# Patient Record
Sex: Male | Born: 1985 | Race: Black or African American | Hispanic: No | Marital: Single | Smoking: Current every day smoker
Health system: Southern US, Community
[De-identification: ages and names within clinical notes are randomized; demographics above are authoritative.]

## PROBLEM LIST (undated history)

## (undated) DIAGNOSIS — F17200 Nicotine dependence, unspecified, uncomplicated: Secondary | ICD-10-CM

---

## 2018-06-26 ENCOUNTER — Ambulatory Visit: Payer: Medicaid Other

## 2018-06-26 ENCOUNTER — Ambulatory Visit
Admission: EM | Admit: 2018-06-26 | Discharge: 2018-06-26 | Disposition: A | Discharge status: Home / Self Care | Payer: Medicaid Other | Attending: Family Medicine | Admitting: Family Medicine

## 2018-06-26 DIAGNOSIS — W260XXA Contact with knife, initial encounter: Secondary | ICD-10-CM | POA: Diagnosis not present

## 2018-06-26 DIAGNOSIS — S81812A Laceration without foreign body, left lower leg, initial encounter: Secondary | ICD-10-CM | POA: Diagnosis not present

## 2018-06-26 DIAGNOSIS — S91114A Laceration without foreign body of right lesser toe(s) without damage to nail, initial encounter: Secondary | ICD-10-CM | POA: Insufficient documentation

## 2018-06-26 DIAGNOSIS — S81802A Unspecified open wound, left lower leg, initial encounter: Secondary | ICD-10-CM | POA: Diagnosis present

## 2018-06-26 MED ORDER — LEVOFLOXACIN 500 MG PO TABS: 500.0000 mg | ORAL_TABLET | Freq: Every day | ORAL | 0 refills | Status: DC

## 2018-06-26 MED ORDER — MUPIROCIN 2 % EX OINT: 1.0000 "application " | TOPICAL_OINTMENT | Freq: Three times a day (TID) | CUTANEOUS | 0 refills | Status: DC
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
## 2018-06-26 NOTE — Discharge Instructions (Addendum)
If any signs or symptoms of wound infection occur outlined in the attached instructions above, go  to the emergency room or return to our clinic immediately.  Recommend following up with a podiatrist next week because of the deep wound on your right foot  with possible tendon damage.
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
## 2018-06-30 ENCOUNTER — Encounter: Payer: Self-pay | Admitting: Emergency Medicine

## 2018-06-30 ENCOUNTER — Other Ambulatory Visit: Payer: Self-pay

## 2018-06-30 ENCOUNTER — Ambulatory Visit
Admission: EM | Admit: 2018-06-30 | Discharge: 2018-06-30 | Disposition: A | Discharge status: Home / Self Care | Payer: Medicaid Other | Attending: Family Medicine | Admitting: Family Medicine

## 2018-06-30 DIAGNOSIS — S81812D Laceration without foreign body, left lower leg, subsequent encounter: Secondary | ICD-10-CM

## 2018-06-30 DIAGNOSIS — S91114D Laceration without foreign body of right lesser toe(s) without damage to nail, subsequent encounter: Secondary | ICD-10-CM

## 2018-06-30 DIAGNOSIS — Z5189 Encounter for other specified aftercare: Secondary | ICD-10-CM

## 2018-06-30 NOTE — ED Provider Notes (Signed)
[facility]    CSN: [id number] Arrival date & time: 06/30/18  0854  History   Chief Complaint Chief Complaint  Patient presents with  . Wound Check   HPI  32 year old male presents for a wound check.  Patient had laceration repaired on his left leg and right foot on 11/21.  Patient was placed on prophylactic antibiotics given the nature and location of his injuries.  He seems to be doing well.  No bleeding.  No drainage.  No fever.  He has no concerns at this time.   Social History Social History   Tobacco Use  . Smoking status: Current Every Day Smoker  . Smokeless tobacco: Never Used  Substance Use Topics  . Alcohol use: Never    Frequency: Never  . Drug use: Never   Allergies   Patient has no known allergies.  Review of Systems Review of Systems  Constitutional: Negative.   Skin: Positive for wound.   Physical Exam Triage Vital Signs ED Triage Vitals  Enc Vitals Group     BP 06/30/18 0857 119/79     Pulse Rate 06/30/18 0857 (!) 58     Resp 06/30/18 0857 18     Temp 06/30/18 0857 (!) 97.5 F (36.4 C)     Temp Source 06/30/18 0857 Oral     SpO2 06/30/18 0857 100 %     Weight 06/30/18 0856 145 lb (65.8 kg)     Height 06/30/18 0856 5\' 6"  (1.676 m)     Head Circumference --      Peak Flow --      Pain Score 06/30/18 0856 0     Pain Loc --      Pain Edu? --      Excl. in GC? --    Updated Vital Signs BP 119/79 (BP Location: Right Arm)   Pulse (!) 58   Temp (!) 97.5 F (36.4 C) (Oral)   Resp 18   Ht 5\' 6"  (1.676 m)   Wt 65.8 kg   SpO2 100%   BMI 23.40 kg/m   Visual Acuity Right Eye Distance:   Left Eye Distance:   Bilateral Distance:    Right Eye Near:   Left Eye Near:    Bilateral Near:     Physical Exam  Constitutional: He appears well-developed. No distress.  Skin:  Left leg -wound appears to be healing well.  There is some surrounding dried blood.  No drainage.  No erythema.  Right foot -doing well.  No fluctuance.  No  drainage.  Nursing note and vitals reviewed.  UC Treatments / Results  Labs (all labs ordered are listed, but only abnormal results are displayed) Labs Reviewed - No data to display  EKG None  Radiology No results found.  Procedures Procedures (including critical care time)  Medications Ordered in UC Medications - No data to display  Initial Impression / Assessment and Plan / UC Course  I have reviewed the triage vital signs and the nursing notes.  Pertinent labs & imaging results that were available during my care of the patient were reviewed by me and considered in my medical decision making (see chart for details).    32 year old male presents for a wound check.  Healing well.  Encouraged to keep the wounds clean.  Wounds were cleaned today.  Sutures out in 10 days from 11/21 (On 12/1).  Final Clinical Impressions(s) / UC Diagnoses   Final diagnoses:  Visit for wound check  Discharge Instructions     Return for Suture removal on 12/1.  Take care  Dr. Adriana Simas    ED Prescriptions    None     Controlled Substance Prescriptions [place] Controlled Substance Registry consulted? Not Applicable   Tommie Sams, [place] 06/30/18 6045

## 2018-06-30 NOTE — Discharge Instructions (Signed)
Return for Suture removal on 12/1.  Take care  Dr. Adriana Simasook

## 2018-06-30 NOTE — ED Triage Notes (Signed)
Patient here for wound check from 2 lacerations on his left left and right foot. Patient states he has been taking his antibiotic that was prescribed.

## 2018-07-07 ENCOUNTER — Ambulatory Visit
Admission: EM | Admit: 2018-07-07 | Discharge: 2018-07-07 | Disposition: A | Discharge status: Home / Self Care | Payer: Medicaid Other

## 2018-07-07 ENCOUNTER — Encounter: Payer: Self-pay | Admitting: Emergency Medicine

## 2018-07-07 ENCOUNTER — Other Ambulatory Visit: Payer: Self-pay

## 2018-07-07 DIAGNOSIS — Z4802 Encounter for removal of sutures: Secondary | ICD-10-CM | POA: Diagnosis not present

## 2018-07-07 DIAGNOSIS — Z5189 Encounter for other specified aftercare: Secondary | ICD-10-CM

## 2018-07-07 NOTE — ED Triage Notes (Addendum)
Patient here for staple removal to left calf and suture removal to right foot.  7 staples removed from left calf. Patient tolerated well. Patient still has drainage and pain on right foot. Provider notified to see patient prior to taking out the stitches.

## 2018-07-07 NOTE — Discharge Instructions (Addendum)
Leave steri strips in place. Continue mupirocin ointment to foot wound three times a day. Follow-up in 2 days for recheck.

## 2018-07-07 NOTE — ED Provider Notes (Signed)
[facility]    CSN: [id number] Arrival date & time: 07/07/18  1842     History   Chief Complaint Chief Complaint  Patient presents with  . Suture / Staple Removal    HPI Theodore Adams is a 32 y.o. male.   Subjective:   Theodore Adams is a 32 y.o. male who presents today for wound check. Patient has a laceration wound which is located on the right foot and left leg. Current symptoms: yellow drainage from suture site on right foot. Symptoms began a few days ago. Pain is rated 0/10. Interventions to date: Started on antibiotics on 11/21 (Levaquan 500 mg QD x 5 days) as well as Bactroban TID. Wound appeared to look well on recheck on 11/25. Patient was told to return to clinic today for suture and staple removal. Patient reports compliance with prescribed therapies. Denies any fevers or pain. Patient has no complaints regarding laceration of left leg   32 year old male presenting for wound check/suture/staple.  Duration of left leg appears to be healing well.  Staples were removed by nursing difficulty.  No signs of infection (drainage, warmth or erythema). However, laceration of right foot appears to have opened up some despite sutures. Patient reports drainage from site. No drainage appreciated on exam. Patient is afebrile. No erythema or warmth noted. He was prescribed a five day course of levaquin at the initial visit. Sutures to be removed and steri strips applied. Patient advised to return to clinic in 48 hours for recheck.  Plan:  1. Discussed appropriate home care of this wound 2. Staples removed from left leg.  3. Sutures removed from right foot and steri strips applied  4. Patient instructions were given. 5. Follow up: 2 days.  Today's evaluation has revealed no signs of a dangerous process. Discussed diagnosis with patient. Patient aware of their diagnosis, possible red flag symptoms to watch out for and need for close follow up. Patient understands verbal and  written discharge instructions. Patient comfortable with plan and disposition.  Patient has a clear mental status at this time, good insight into illness (after discussion and teaching) and has clear judgment to make decisions regarding their care.  Documentation was completed with the aid of voice recognition software. Transcription may contain typographical errors.     History reviewed. No pertinent past medical history.  There are no active problems to display for this patient.   History reviewed. No pertinent surgical history.     Home Medications    Prior to Admission medications   Medication Sig Start Date End Date Taking? Authorizing Provider  mupirocin ointment (BACTROBAN) 2 % Apply 1 application topically 3 (three) times daily. 06/26/18  Yes Lutricia Feil, PA-C    Family History Family History  Problem Relation Age of Onset  . Healthy Mother   . Healthy Father     Social History Social History   Tobacco Use  . Smoking status: Current Every Day Smoker  . Smokeless tobacco: Never Used  Substance Use Topics  . Alcohol use: Never    Frequency: Never  . Drug use: Never     Allergies   Patient has no known allergies.   Review of Systems Review of Systems  Constitutional: Negative for fever.  Skin: Positive for wound.  All other systems reviewed and are negative.    Physical Exam Triage Vital Signs ED Triage Vitals  Enc Vitals Group     BP 07/07/18 1932 94/69     Pulse  Rate 07/07/18 1932 66     Resp 07/07/18 1932 18     Temp 07/07/18 1932 99 F (37.2 C)     Temp Source 07/07/18 1932 Oral     SpO2 07/07/18 1932 99 %     Weight 07/07/18 1931 145 lb (65.8 kg)     Height 07/07/18 1931 5\' 6"  (1.676 m)     Head Circumference --      Peak Flow --      Pain Score 07/07/18 1930 0     Pain Loc --      Pain Edu? --      Excl. in GC? --    No data found.  Updated Vital Signs BP 94/69 (BP Location: Right Arm)   Pulse 66   Temp 99 F (37.2 C)  (Oral)   Resp 18   Ht 5\' 6"  (1.676 m)   Wt 145 lb (65.8 kg)   SpO2 99%   BMI 23.40 kg/m   Visual Acuity Right Eye Distance:   Left Eye Distance:   Bilateral Distance:    Right Eye Near:   Left Eye Near:    Bilateral Near:     Physical Exam  Constitutional: He is oriented to person, place, and time. He appears well-developed and well-nourished.  Neck: Normal range of motion. Neck supple.  Cardiovascular: Normal rate and regular rhythm.  Pulmonary/Chest: Effort normal and breath sounds normal.  Musculoskeletal: Normal range of motion.       Right foot: There is laceration. There is no tenderness, no swelling and normal capillary refill.  Feet:  Right Foot:  Skin Integrity: Negative for erythema or warmth.  Neurological: He is alert and oriented to person, place, and time.  Skin: Skin is warm and dry.  Psychiatric: He has a normal mood and affect.       UC Treatments / Results  Labs (all labs ordered are listed, but only abnormal results are displayed) Labs Reviewed - No data to display  EKG None  Radiology No results found.  Procedures Procedures (including critical care time)  Medications Ordered in UC Medications - No data to display  Initial Impression / Assessment and Plan / UC Course  I have reviewed the triage vital signs and the nursing notes.  Pertinent labs & imaging results that were available during my care of the patient were reviewed by me and considered in my medical decision making (see chart for details).     32 year old male presenting for wound check/suture/staple.  Duration of left leg appears to be healing well.  Staples were removed by nursing difficulty.  No signs of infection (drainage, warmth or erythema). However, laceration of right foot appears to have opened up some despite sutures. Patient reports drainage from site. No drainage appreciated on exam. Patient is afebrile. No erythema or warmth noted. He was prescribed a five day  course of levaquin at the initial visit. Sutures to be removed and steri strips applied. Patient advised to return to clinic in 48 hours for recheck.  Plan:  1. Discussed appropriate home care of this wound 2. Staples removed from left leg.  3. Sutures removed from right foot and steri strips applied  4. Patient instructions were given. 5. Follow up: 2 days.  Today's evaluation has revealed no signs of a dangerous process. Discussed diagnosis with patient. Patient aware of their diagnosis, possible red flag symptoms to watch out for and need for close follow up. Patient understands verbal and written discharge instructions.  Patient comfortable with plan and disposition.  Patient has a clear mental status at this time, good insight into illness (after discussion and teaching) and has clear judgment to make decisions regarding their care.  Documentation was completed with the aid of voice recognition software. Transcription may contain typographical errors. Final Clinical Impressions(s) / UC Diagnoses   Final diagnoses:  Visit for wound check  Visit for suture removal  Removal of staples     Discharge Instructions     Leave steri strips in place. Continue mupirocin ointment to foot wound three times a day. Follow-up in 2 days for recheck.     ED Prescriptions    None     Controlled Substance Prescriptions [place] Controlled Substance Registry consulted? Not Applicable   Lurline IdolMurrill, Rashmi Tallent, FNP 07/07/18 2001

## 2018-07-10 ENCOUNTER — Ambulatory Visit
Admission: EM | Admit: 2018-07-10 | Discharge: 2018-07-10 | Disposition: A | Discharge status: Home / Self Care | Payer: Medicaid Other | Attending: Family Medicine | Admitting: Family Medicine

## 2018-07-10 DIAGNOSIS — Z5189 Encounter for other specified aftercare: Secondary | ICD-10-CM

## 2018-07-10 NOTE — Discharge Instructions (Signed)
Leave steri strips on.  Replace if needed.  See Podiatry.  Take care  Dr. Adriana Simasook

## 2018-07-10 NOTE — ED Provider Notes (Signed)
[facility]    CSN: [id number] Arrival date & time: 07/10/18  1642  History   Chief Complaint Chief Complaint  Patient presents with  . Wound Check   HPI  32 year old male presents for wound check.  Recently had sutures and staples removed.  Right foot wound remains open and has not yet healed.  Mild drainage.  Worse in the feet.  Mild pain.  Still sore.  Use he has not followed up with podiatry.  Presents today for wound check.  No fever.  No other reported symptoms.  No other complaints.  Social History Social History   Tobacco Use  . Smoking status: Current Every Day Smoker  . Smokeless tobacco: Never Used  Substance Use Topics  . Alcohol use: Never    Frequency: Never  . Drug use: Never    Allergies   Patient has no known allergies.   Review of Systems Review of Systems  Constitutional: Negative.   Skin: Positive for wound.   Physical Exam Triage Vital Signs ED Triage Vitals  Enc Vitals Group     BP 07/10/18 1655 120/80     Pulse Rate 07/10/18 1655 70     Resp 07/10/18 1655 18     Temp 07/10/18 1655 98.2 F (36.8 C)     Temp Source 07/10/18 1655 Oral     SpO2 07/10/18 1655 100 %     Weight 07/10/18 1656 145 lb 1 oz (65.8 kg)     Height --      Head Circumference --      Peak Flow --      Pain Score 07/10/18 1656 0     Pain Loc --      Pain Edu? --      Excl. in GC? --    Updated Vital Signs BP 120/80 (BP Location: Right Arm)   Pulse 70   Temp 98.2 F (36.8 C) (Oral)   Resp 18   Wt 65.8 kg   SpO2 100%   BMI 23.41 kg/m   Visual Acuity Right Eye Distance:   Left Eye Distance:   Bilateral Distance:    Right Eye Near:   Left Eye Near:    Bilateral Near:     Physical Exam  Constitutional: He is oriented to person, place, and time. He appears well-developed. No distress.  Pulmonary/Chest: Effort normal. No respiratory distress.  Neurological: He is alert and oriented to person, place, and time.  Skin:  Right foot - open  wound noted.  Mild drainage.  Psychiatric: He has a normal mood and affect. His behavior is normal.  Nursing note and vitals reviewed.  UC Treatments / Results  Labs (all labs ordered are listed, but only abnormal results are displayed) Labs Reviewed - No data to display  EKG None  Radiology No results found.  Procedures Procedures (including critical care time)  Medications Ordered in UC Medications - No data to display  Initial Impression / Assessment and Plan / UC Course  I have reviewed the triage vital signs and the nursing notes.  Pertinent labs & imaging results that were available during my care of the patient were reviewed by me and considered in my medical decision making (see chart for details).    32 year old male presents for wound check.  Wound is not yet healed.  Healing by secondary intention.  Steri-Strips placed to help approximate the wound better.  Advised to see podiatry.  Final Clinical Impressions(s) / UC Diagnoses   Final  diagnoses:  Visit for wound check     Discharge Instructions     Leave steri strips on.  Replace if needed.  See Podiatry.  Take care  Dr. Adriana Simasook    ED Prescriptions    None     Controlled Substance Prescriptions [place] Controlled Substance Registry consulted? Not Applicable   Tommie SamsCook, Kiarah Eckstein G, DO 07/10/18 1756
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
## 2020-08-25 ENCOUNTER — Encounter: Payer: Self-pay | Admitting: Emergency Medicine

## 2020-08-25 ENCOUNTER — Emergency Department
Admission: EM | Admit: 2020-08-25 | Discharge: 2020-08-25 | Disposition: A | Discharge status: Home / Self Care | Payer: Medicaid Other | Attending: Emergency Medicine | Admitting: Emergency Medicine

## 2020-08-25 ENCOUNTER — Other Ambulatory Visit: Payer: Self-pay

## 2020-08-25 ENCOUNTER — Emergency Department: Payer: Medicaid Other

## 2020-08-25 DIAGNOSIS — M546 Pain in thoracic spine: Secondary | ICD-10-CM | POA: Diagnosis not present

## 2020-08-25 DIAGNOSIS — R102 Pelvic and perineal pain: Secondary | ICD-10-CM | POA: Diagnosis not present

## 2020-08-25 DIAGNOSIS — M25551 Pain in right hip: Secondary | ICD-10-CM | POA: Insufficient documentation

## 2020-08-25 DIAGNOSIS — F17200 Nicotine dependence, unspecified, uncomplicated: Secondary | ICD-10-CM | POA: Insufficient documentation

## 2020-08-25 MED ORDER — METHOCARBAMOL 500 MG PO TABS: 500.0000 mg | ORAL_TABLET | Freq: Three times a day (TID) | ORAL | 0 refills | Status: AC | PRN

## 2020-08-25 MED ORDER — MELOXICAM 15 MG PO TABS: 15.0000 mg | ORAL_TABLET | Freq: Every day | ORAL | 2 refills | Status: AC
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
## 2020-08-25 NOTE — Discharge Instructions (Signed)
Take Meloxicam and Robaxin as directed.  
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
## 2023-12-21 ENCOUNTER — Inpatient Hospital Stay
Admission: EM | Admit: 2023-12-21 | Discharge: 2023-12-30 | DRG: 392 | Disposition: A | Discharge status: Home / Self Care | Attending: Hospitalist | Admitting: Hospitalist

## 2023-12-21 ENCOUNTER — Emergency Department

## 2023-12-21 DIAGNOSIS — K56609 Unspecified intestinal obstruction, unspecified as to partial versus complete obstruction: Secondary | ICD-10-CM

## 2023-12-21 DIAGNOSIS — A0811 Acute gastroenteropathy due to Norwalk agent: Principal | ICD-10-CM | POA: Diagnosis present

## 2023-12-21 DIAGNOSIS — R112 Nausea with vomiting, unspecified: Secondary | ICD-10-CM | POA: Diagnosis present

## 2023-12-21 DIAGNOSIS — K567 Ileus, unspecified: Secondary | ICD-10-CM | POA: Diagnosis not present

## 2023-12-21 DIAGNOSIS — E876 Hypokalemia: Secondary | ICD-10-CM | POA: Diagnosis not present

## 2023-12-21 DIAGNOSIS — K529 Noninfective gastroenteritis and colitis, unspecified: Secondary | ICD-10-CM | POA: Diagnosis not present

## 2023-12-21 DIAGNOSIS — E871 Hypo-osmolality and hyponatremia: Secondary | ICD-10-CM | POA: Diagnosis present

## 2023-12-21 DIAGNOSIS — F17200 Nicotine dependence, unspecified, uncomplicated: Secondary | ICD-10-CM | POA: Diagnosis present

## 2023-12-21 DIAGNOSIS — Z8249 Family history of ischemic heart disease and other diseases of the circulatory system: Secondary | ICD-10-CM

## 2023-12-21 DIAGNOSIS — E861 Hypovolemia: Secondary | ICD-10-CM | POA: Diagnosis present

## 2023-12-21 HISTORY — DX: Nicotine dependence, unspecified, uncomplicated: F17.200

## 2023-12-21 LAB — URINALYSIS, ROUTINE W REFLEX MICROSCOPIC
Bacteria, UA: NONE SEEN
Bilirubin Urine: NEGATIVE
Glucose, UA: NEGATIVE mg/dL
Ketones, ur: NEGATIVE mg/dL
Leukocytes,Ua: NEGATIVE
Nitrite: NEGATIVE
Protein, ur: 30 mg/dL — AB
Specific Gravity, Urine: 1.026 (ref 1.005–1.030)
Squamous Epithelial / HPF: 0 /HPF (ref 0–5)
pH: 5 (ref 5.0–8.0)

## 2023-12-21 LAB — PHOSPHORUS: Phosphorus: 3 mg/dL (ref 2.5–4.6)

## 2023-12-21 LAB — COMPREHENSIVE METABOLIC PANEL WITH GFR
ALT: 31 U/L (ref 0–44)
AST: 35 U/L (ref 15–41)
Albumin: 2.6 g/dL — ABNORMAL LOW (ref 3.5–5.0)
Alkaline Phosphatase: 88 U/L (ref 38–126)
Anion gap: 16 — ABNORMAL HIGH (ref 5–15)
BUN: 21 mg/dL — ABNORMAL HIGH (ref 6–20)
CO2: 22 mmol/L (ref 22–32)
Calcium: 8.4 mg/dL — ABNORMAL LOW (ref 8.9–10.3)
Chloride: 88 mmol/L — ABNORMAL LOW (ref 98–111)
Creatinine, Ser: 0.94 mg/dL (ref 0.61–1.24)
GFR, Estimated: >60 mL/min (ref >60)
Glucose, Bld: 138 mg/dL — ABNORMAL HIGH (ref 70–99)
Potassium: 4.3 mmol/L (ref 3.5–5.1)
Sodium: 126 mmol/L — ABNORMAL LOW (ref 135–145)
Total Bilirubin: 1.4 mg/dL — ABNORMAL HIGH (ref 0.0–1.2)
Total Protein: 7 g/dL (ref 6.5–8.1)

## 2023-12-21 LAB — BASIC METABOLIC PANEL WITH GFR
Anion gap: 12 (ref 5–15)
BUN: 17 mg/dL (ref 6–20)
CO2: 26 mmol/L (ref 22–32)
Calcium: 8 mg/dL — ABNORMAL LOW (ref 8.9–10.3)
Chloride: 93 mmol/L — ABNORMAL LOW (ref 98–111)
Creatinine, Ser: 0.85 mg/dL (ref 0.61–1.24)
GFR, Estimated: >60 mL/min (ref >60)
Glucose, Bld: 124 mg/dL — ABNORMAL HIGH (ref 70–99)
Potassium: 3.9 mmol/L (ref 3.5–5.1)
Sodium: 131 mmol/L — ABNORMAL LOW (ref 135–145)

## 2023-12-21 LAB — LACTIC ACID, PLASMA: Lactic Acid, Venous: 1.5 mmol/L (ref 0.5–1.9)

## 2023-12-21 LAB — CBC
HCT: 42.8 % (ref 39.0–52.0)
Hemoglobin: 15 g/dL (ref 13.0–17.0)
MCH: 29 pg (ref 26.0–34.0)
MCHC: 35 g/dL (ref 30.0–36.0)
MCV: 82.6 fL (ref 80.0–100.0)
Platelets: 212 10*3/uL (ref 150–400)
RBC: 5.18 MIL/uL (ref 4.22–5.81)
RDW: 12.9 % (ref 11.5–15.5)
WBC: 31.8 10*3/uL — ABNORMAL HIGH (ref 4.0–10.5)
nRBC: 0 % (ref 0.0–0.2)

## 2023-12-21 LAB — MAGNESIUM: Magnesium: 2.2 mg/dL (ref 1.7–2.4)

## 2023-12-21 LAB — LIPASE, BLOOD: Lipase: 32 U/L (ref 11–51)

## 2023-12-21 LAB — OSMOLALITY, URINE: Osmolality, Ur: 464 mosm/kg (ref 300–900)

## 2023-12-21 LAB — OSMOLALITY: Osmolality: 276 mosm/kg (ref 275–295)

## 2023-12-21 LAB — SODIUM, URINE, RANDOM: Sodium, Ur: <10 mmol/L

## 2023-12-21 MED ORDER — ONDANSETRON HCL 4 MG/2ML IJ SOLN
4.0000 mg | Freq: Once | INTRAMUSCULAR | Status: AC
  Administered 2023-12-21: 4 mg via INTRAVENOUS
  Filled 2023-12-21: qty 2

## 2023-12-21 MED ORDER — MORPHINE SULFATE (PF) 2 MG/ML IV SOLN
2.0000 mg | INTRAVENOUS | Status: DC | PRN
  Administered 2023-12-22: 2 mg via INTRAVENOUS
  Filled 2023-12-21: qty 1

## 2023-12-21 MED ORDER — LACTATED RINGERS IV SOLN: INTRAVENOUS | Status: DC

## 2023-12-21 MED ORDER — LACTATED RINGERS IV SOLN
INTRAVENOUS | Status: DC
Start: 2023-12-21 — End: 2023-12-22

## 2023-12-21 MED ORDER — NICOTINE 21 MG/24HR TD PT24
21.0000 mg | MEDICATED_PATCH | Freq: Every day | TRANSDERMAL | Status: DC
  Filled 2023-12-21 (×5): qty 1

## 2023-12-21 MED ORDER — IOHEXOL 300 MG/ML  SOLN
80.0000 mL | Freq: Once | INTRAMUSCULAR | Status: AC | PRN
  Administered 2023-12-21: 80 mL via INTRAVENOUS

## 2023-12-21 MED ORDER — LACTATED RINGERS IV BOLUS
1000.0000 mL | Freq: Once | INTRAVENOUS | Status: AC
  Administered 2023-12-21: 1000 mL via INTRAVENOUS

## 2023-12-21 MED ORDER — SODIUM CHLORIDE 1 G PO TABS: 1.0000 g | ORAL_TABLET | Freq: Two times a day (BID) | ORAL | Status: DC

## 2023-12-21 MED ORDER — OXYCODONE-ACETAMINOPHEN 5-325 MG PO TABS
1.0000 | ORAL_TABLET | ORAL | Status: DC | PRN
  Administered 2023-12-22: 1 via ORAL
  Filled 2023-12-21: qty 1

## 2023-12-21 MED ORDER — MORPHINE SULFATE (PF) 4 MG/ML IV SOLN
4.0000 mg | Freq: Once | INTRAVENOUS | Status: AC
  Administered 2023-12-21: 4 mg via INTRAVENOUS
  Filled 2023-12-21: qty 1

## 2023-12-21 MED ORDER — PIPERACILLIN-TAZOBACTAM 3.375 G IVPB 30 MIN
3.3750 g | Freq: Once | INTRAVENOUS | Status: AC
  Administered 2023-12-21: 3.375 g via INTRAVENOUS
  Filled 2023-12-21 (×2): qty 50

## 2023-12-21 MED ORDER — ACETAMINOPHEN 325 MG PO TABS: 650.0000 mg | ORAL_TABLET | Freq: Four times a day (QID) | ORAL | Status: DC | PRN

## 2023-12-21 MED ORDER — PIPERACILLIN-TAZOBACTAM 3.375 G IVPB
3.3750 g | Freq: Three times a day (TID) | INTRAVENOUS | Status: DC
  Administered 2023-12-22 – 2023-12-23 (×4): 3.375 g via INTRAVENOUS
  Filled 2023-12-21 (×4): qty 50

## 2023-12-21 MED ORDER — ONDANSETRON HCL 4 MG/2ML IJ SOLN
4.0000 mg | Freq: Three times a day (TID) | INTRAMUSCULAR | Status: DC | PRN
  Administered 2023-12-22: 4 mg via INTRAVENOUS
  Filled 2023-12-21: qty 2

## 2023-12-21 NOTE — ED Provider Notes (Addendum)
 [facility] Provider Note    Event Date/Time   First MD Initiated Contact with Patient 12/21/23 1649     (approximate)   History   Emesis and Diarrhea   HPI  Theodore Adams is a 38 y.o. male who presents to the ED for evaluation of Emesis and Diarrhea   Patient presents with his girlfriend for evaluation of lower abdominal pain and diarrhea over the past 7 days.  Symptoms started this past Sunday, he had 1 episode of emesis around then, but is had persistent lower abdominal pain and diarrhea.  They estimate 10-15 episodes of loose stool per day.  Some dizziness and weakness but no syncope.  No dysuria.    No history of abdominal surgeries.  No known fevers.  No recent antibiotics for any reason.   Physical Exam   Triage Vital Signs: ED Triage Vitals  Encounter Vitals Group     BP 12/21/23 1642 (!) 129/91     Systolic BP Percentile --      Diastolic BP Percentile --      Pulse Rate 12/21/23 1642 100     Resp 12/21/23 1642 20     Temp 12/21/23 1642 99.7 F (37.6 C)     Temp Source 12/21/23 1642 Oral     SpO2 12/21/23 1642 99 %     Weight 12/21/23 1644 135 lb (61.2 kg)     Height 12/21/23 1644 5\' 6"  (1.676 m)     Head Circumference --      Peak Flow --      Pain Score 12/21/23 1644 10     Pain Loc --      Pain Education --      Exclude from Growth Chart --     Most recent vital signs: Vitals:   12/21/23 1642  BP: (!) 129/91  Pulse: 100  Resp: 20  Temp: 99.7 F (37.6 C)  SpO2: 99%    General: Awake, no distress.  Looks dry mildly uncomfortable CV:  Good peripheral perfusion.  Resp:  Normal effort.  Abd:  No distention.  RLQ tenderness is present around McBurney's point with some voluntary guarding on deeper palpation.  Benign upper abdomen.  Lesser tenderness to the LLQ MSK:  No deformity noted.  Neuro:  No focal deficits appreciated. Other:     ED Results / Procedures / Treatments   Labs (all labs ordered are listed, but  only abnormal results are displayed) Labs Reviewed  COMPREHENSIVE METABOLIC PANEL WITH GFR - Abnormal; Notable for the following components:      Result Value   Sodium 126 (*)    Chloride 88 (*)    Glucose, Bld 138 (*)    BUN 21 (*)    Calcium 8.4 (*)    Albumin 2.6 (*)    Total Bilirubin 1.4 (*)    Anion gap 16 (*)    All other components within normal limits  CBC - Abnormal; Notable for the following components:   WBC 31.8 (*)    All other components within normal limits  CULTURE, BLOOD (ROUTINE X 2)  CULTURE, BLOOD (ROUTINE X 2)  C DIFFICILE QUICK SCREEN W PCR REFLEX    GASTROINTESTINAL PANEL BY PCR, STOOL (REPLACES STOOL CULTURE)  LIPASE, BLOOD  LACTIC ACID, PLASMA  URINALYSIS, ROUTINE W REFLEX MICROSCOPIC    EKG   RADIOLOGY CT abd/pelvis interpreted by me with dilated loops of bowel  Official radiology report(s): CT ABDOMEN PELVIS W CONTRAST Result Date:  12/21/2023 CLINICAL DATA:  lower abd pain, diarrhea. WBC 31k EXAM: CT ABDOMEN AND PELVIS WITH CONTRAST TECHNIQUE: Multidetector CT imaging of the abdomen and pelvis was performed using the standard protocol following bolus administration of intravenous contrast. RADIATION DOSE REDUCTION: This exam was performed according to the departmental dose-optimization program which includes automated exposure control, adjustment of the mA and/or kV according to patient size and/or use of iterative reconstruction technique. CONTRAST:  80mL OMNIPAQUE IOHEXOL 300 MG/ML  SOLN COMPARISON:  None Available. FINDINGS: Lower chest: No acute abnormality. Hepatobiliary: No focal liver abnormality is seen. No gallstones, gallbladder wall thickening, or biliary dilatation. Pancreas: Unremarkable. No pancreatic ductal dilatation or surrounding inflammatory changes. Spleen: Normal in size without focal abnormality. Adrenals/Urinary Tract: Adrenal glands are unremarkable. Kidneys are normal, without renal calculi, focal lesion, or hydronephrosis. Bladder  is unremarkable. Stomach/Bowel: There is dilation of the distal colon with marked circumferential wall thickening and submucosal enhancement of the rectum. There is adjacent fat stranding in the perirectal fat. Submucosal enhancement and wall thickening extends into the sigmoid colon and descending colon. These portions of the colon are relatively featureless in appearance. There is moderate upstream colonic stool volume. Appendix is normal. Stomach is unremarkable for degree of distension. No definitive small bowel obstruction although several loops of small bowel are prominent in appearance. Vascular/Lymphatic: Aorta is normal in course and caliber. Prominent mesenteric venous vasculature. No definitive lymphadenopathy identified. Reproductive: Prostate is unremarkable. Other: No free air. Musculoskeletal: No acute or significant osseous findings. IMPRESSION: 1. There is dilation, marked circumferential wall thickening and submucosal enhancement of the rectum with adjacent fat stranding. This extends into the descending colon in a contiguous fashion. Findings are favored to reflect an inflammatory colitis such as ulcerative colitis although infectious colitis remains in the differential. Recommend correlation with laboratory values and consideration of colonoscopy when clinically appropriate. Electronically Signed   By: Clancy Crimes M.D.   On: 12/21/2023 18:22    PROCEDURES and INTERVENTIONS:  .Critical Care  Performed by: Arline Bennett, MD Authorized by: Arline Bennett, MD   Critical care provider statement:    Critical care time (minutes):  30   Critical care time was exclusive of:  Separately billable procedures and treating other patients   Critical care was necessary to treat or prevent imminent or life-threatening deterioration of the following conditions:  Sepsis   Critical care was time spent personally by me on the following activities:  Development of treatment plan with patient or  surrogate, discussions with consultants, evaluation of patient's response to treatment, examination of patient, ordering and review of laboratory studies, ordering and review of radiographic studies, ordering and performing treatments and interventions, pulse oximetry, re-evaluation of patient's condition and review of old charts   Medications  morphine (PF) 2 MG/ML injection 2 mg (has no administration in time range)  oxyCODONE-acetaminophen (PERCOCET/ROXICET) 5-325 MG per tablet 1 tablet (has no administration in time range)  lactated ringers infusion (has no administration in time range)  lactated ringers bolus 1,000 mL (0 mLs Intravenous Stopped 12/21/23 1827)  ondansetron (ZOFRAN) injection 4 mg (4 mg Intravenous Given 12/21/23 1718)  morphine (PF) 4 MG/ML injection 4 mg (4 mg Intravenous Given 12/21/23 1718)  piperacillin-tazobactam (ZOSYN) IVPB 3.375 g (0 g Intravenous Stopped 12/21/23 1826)  iohexol (OMNIPAQUE) 300 MG/ML solution 80 mL (80 mLs Intravenous Contrast Given 12/21/23 1757)     IMPRESSION / MDM / ASSESSMENT AND PLAN / ED COURSE  I reviewed the triage vital signs and the nursing notes.  Differential diagnosis includes, but is not limited to, appendicitis, perforation, sepsis, colitis, diverticulitis, abscess, UTI, pyelonephritis  {Patient presents with symptoms of an acute illness or injury that is potentially life-threatening.  Generally healthy 39 year old presents with a week of abdominal pain and diarrhea.  He has significant RLQ tenderness, leukocytosis and tachycardia.  Will add sepsis screening protocols, CT scan, IV fluids.  Patient presents with 1 week of severe diarrhea and abdominal pain with signs of severe colitis requiring medical admission. No known hx of UC, IBD or colonoscopies.  Tachycardia, borderline temperature and leukocytosis to 32 indicate sepsis protocols.  Hyponatremia without AKI.  Mild anion gap.  CT, as above.  Provided fluids, antibiotics.  Stool  sample pending.  Consult medicine for admission.  Clinical Course as of 12/21/23 1849  Sat Dec 21, 2023  1710 Updated patient, nurse and girlfriend of elevated white blood cell count.  Will add on blood cultures, lactic acid [DS]  1750 Reassessed.  Sister and cousin at the bedside.  Discussed my concerns, abnormal workup, plan of care. [DS]  1848 Update patient and family of CT results and possible etiologies of symptoms.  I then consulted hospitalist for admission. [DS]    Clinical Course User Index [DS] Arline Bennett, MD     FINAL CLINICAL IMPRESSION(S) / ED DIAGNOSES   Final diagnoses:  Colitis     Rx / DC Orders   ED Discharge Orders     None        Note:  This document was prepared using Dragon voice recognition software and may include unintentional dictation errors.   Arline Bennett, MD 12/21/23 1840    Arline Bennett, MD 12/21/23 [id number]

## 2023-12-21 NOTE — ED Triage Notes (Signed)
 Pt presents to the ED POV from home with significant other for generalized abdominal pain, nausea, vomiting, and diarrhea since Sunday.

## 2023-12-21 NOTE — Progress Notes (Signed)
 CODE SEPSIS - PHARMACY COMMUNICATION  **Broad Spectrum Antibiotics should be administered within 1 hour of Sepsis diagnosis**  Time Code Sepsis Called/Page Received: 1842  Antibiotics Ordered: Zosyn  Time of 1st antibiotic administration: 1728  Additional action taken by pharmacy: N/A  If necessary, Name of Provider/Nurse Contacted: N/A    Alice Innocent ,PharmD Clinical Pharmacist  12/21/2023  6:42 PM

## 2023-12-21 NOTE — ED Notes (Signed)
 Patient transported to CT

## 2023-12-21 NOTE — Sepsis Progress Note (Signed)
 Elink following for sepsis protocol.

## 2023-12-21 NOTE — H&P (Signed)
 History and Physical    Theodore Adams ZOX:[id number] DOB: 1986-04-15 DOA: 12/21/2023  Referring MD/NP/PA:   PCP: [facility] [employer]   Patient coming from:  The patient is coming from home.     Chief Complaint: Nausea, vomiting, diarrhea, abdominal pain  HPI: Theodore Adams is a 38 y.o. male with medical history significant of smoker, who presents with, nausea, vomiting, diarrhea, abdominal pain.  Patient states that his symptoms have been going on for almost a week, including nausea, vomiting, diarrhea and abdominal pain.  He has multiple episodes of nonbilious nonbloody vomiting, more than 10 times of watery diarrhea each day.  His abdominal pain is located in the center and lower abdomen, constant, cramping, moderate to severe, nonradiating, not aggravated or alleviated any known factors.  No fever or chills.  No recent antibiotics use.  No chest pain, cough, SOB.  No symptoms of UTI.    Data reviewed independently and ED Course: pt was found to have WBC 31.8, lactic acid of 1.5, GFR> 60, sodium 126, temperature 99.7, blood pressure 129/91, heart rate of 100, RR 20, oxygen saturation 99% on room air.  Patient is placed in MedSurg bed for observation.  CT abdomen/pelvis: 1. There is dilation, marked circumferential wall thickening and submucosal enhancement of the rectum with adjacent fat stranding. This extends into the descending colon in a contiguous fashion. Findings are favored to reflect an inflammatory colitis such as ulcerative colitis although infectious colitis remains in the differential. Recommend correlation with laboratory values and consideration of colonoscopy when clinically appropriate.     EKG:  Not done in ED, will get one.    Review of Systems:   General: no fevers, chills, no body weight gain, has poor appetite, has fatigue HEENT: no blurry vision, hearing changes or sore throat Respiratory: no dyspnea, coughing, wheezing CV: no chest  pain, no palpitations GI: has nausea, vomiting, abdominal pain, diarrhea GU: no dysuria, burning on urination, increased urinary frequency, hematuria  Ext: no leg edema Neuro: no unilateral weakness, numbness, or tingling, no vision change or hearing loss Skin: no rash, no skin tear. MSK: No muscle spasm, no deformity, no limitation of range of movement in spin Heme: No easy bruising.  Travel history: No recent long distant travel.   Allergy: No Known Allergies  Past Medical History:  Diagnosis Date   Smoker     History reviewed. No pertinent surgical history.  Social History:  reports that he has been smoking. He has never used smokeless tobacco. He reports that he does not drink alcohol and does not use drugs.  Family History:  Family History  Problem Relation Age of Onset   Heart attack Mother    Heart attack Father      Prior to Admission medications   Medication Sig Start Date End Date Taking? Authorizing Provider  mupirocin  ointment (BACTROBAN ) 2 % Apply 1 application topically 3 (three) times daily. 06/26/18   Georgiann Kirsch, PA-C    Physical Exam: Vitals:   12/21/23 1642 12/21/23 1644  BP: (!) 129/91   Pulse: 100   Resp: 20   Temp: 99.7 F (37.6 C)   TempSrc: Oral   SpO2: 99%   Weight:  61.2 kg  Height:  5\' 6"  (1.676 m)   General: Not in acute distress.  Dry mucous membrane HEENT:       Eyes: PERRL, EOMI, no jaundice       ENT: No discharge from the ears and nose, no pharynx  injection, no tonsillar enlargement.        Neck: No JVD, no bruit, no mass felt. Heme: No neck lymph node enlargement. Cardiac: S1/S2, RRR, No murmurs, No gallops or rubs. Respiratory: No rales, wheezing, rhonchi or rubs. GI: Soft, nondistended, has tenderness in central and lower abdomen, no rebound pain, no organomegaly, BS present. GU: No hematuria Ext: No pitting leg edema bilaterally. 1+DP/PT pulse bilaterally. Musculoskeletal: No joint deformities, No joint redness or  warmth, no limitation of ROM in spin. Skin: No rashes.  Neuro: Alert, oriented X3, cranial nerves II-XII grossly intact, moves all extremities normally. Psych: Patient is not psychotic, no suicidal or hemocidal ideation.  Labs on Admission: I have personally reviewed following labs and imaging studies  CBC: Recent Labs  Lab 12/21/23 1647  WBC 31.8*  HGB 15.0  HCT 42.8  MCV 82.6  PLT 212   Basic Metabolic Panel: Recent Labs  Lab 12/21/23 1647  NA 126*  K 4.3  CL 88*  CO2 22  GLUCOSE 138*  BUN 21*  CREATININE 0.94  CALCIUM 8.4*  MG 2.2  PHOS 3.0   GFR: Estimated Creatinine Clearance: 94 mL/min (by C-G formula based on SCr of 0.94 mg/dL). Liver Function Tests: Recent Labs  Lab 12/21/23 1647  AST 35  ALT 31  ALKPHOS 88  BILITOT 1.4*  PROT 7.0  ALBUMIN 2.6*   Recent Labs  Lab 12/21/23 1647  LIPASE 32   No results for input(s): "AMMONIA" in the last 168 hours. Coagulation Profile: No results for input(s): "INR", "PROTIME" in the last 168 hours. Cardiac Enzymes: No results for input(s): "CKTOTAL", "CKMB", "CKMBINDEX", "TROPONINI" in the last 168 hours. BNP (last 3 results) No results for input(s): "PROBNP" in the last 8760 hours. HbA1C: No results for input(s): "HGBA1C" in the last 72 hours. CBG: No results for input(s): "GLUCAP" in the last 168 hours. Lipid Profile: No results for input(s): "CHOL", "HDL", "LDLCALC", "TRIG", "CHOLHDL", "LDLDIRECT" in the last 72 hours. Thyroid Function Tests: No results for input(s): "TSH", "T4TOTAL", "FREET4", "T3FREE", "THYROIDAB" in the last 72 hours. Anemia Panel: No results for input(s): "VITAMINB12", "FOLATE", "FERRITIN", "TIBC", "IRON", "RETICCTPCT" in the last 72 hours. Urine analysis:    Component Value Date/Time   COLORURINE YELLOW (A) 12/21/2023 1900   APPEARANCEUR CLEAR (A) 12/21/2023 1900   LABSPEC 1.026 12/21/2023 1900   PHURINE 5.0 12/21/2023 1900   GLUCOSEU NEGATIVE 12/21/2023 1900   HGBUR MODERATE  (A) 12/21/2023 1900   BILIRUBINUR NEGATIVE 12/21/2023 1900   KETONESUR NEGATIVE 12/21/2023 1900   PROTEINUR 30 (A) 12/21/2023 1900   NITRITE NEGATIVE 12/21/2023 1900   LEUKOCYTESUR NEGATIVE 12/21/2023 1900   Sepsis Labs: @LABRCNTIP (procalcitonin:4,lacticidven:4) )No results found for this or any previous visit (from the past 240 hours).   Radiological Exams on Admission:   Assessment/Plan Principal Problem:   Acute colitis Active Problems:   Hyponatremia   Smoker   Assessment and Plan:   Acute colitis: CT scan showed dilation, marked circumferential wall thickening and submucosal enhancement of the rectum with adjacent fat stranding. This extends into the descending colon in a contiguous fashion.  Differential diagnosis include inflammatory colitis and infectious colitis.  Patient does not have family history of inflammatory colitis.  No recent antibiotics use.  Patient has WBC 31.8, but no fever.  Lactic acid normal, clinically does not seem to have sepsis.  -Place in MedSurg bed for observation - IV Zosyn was started in ED, will continue - Follow-up of blood culture - Follow-up C. difficile and GI  pathogen panel - IV fluid: 2 L LR, then 150 cc/h - As needed Zofran for nausea vomiting - As needed morphine, Percocet, Tylenol for abdominal pain  Hyponatremia: Sodium 126.  Mental status normal. - Will check urine sodium, urine osmolality, serum osmolality. - Fluid restriction - IVF: as above - Sodium chloride tablet 1 g twice daily - f/u by BMP q8h - avoid over correction too fast due to risk of central pontine myelinolysis  Smoker -Nicotine patch      DVT ppx: SCD  Code Status: Full code   Family Communication:    Yes, patient's sister and girlfriend    at bed side.     Disposition Plan:  Anticipate discharge back to previous environment  Consults called:  none  Admission status and Level of care: Med-Surg:    for obs    Dispo: The patient is from:  Home              Anticipated d/c is to: Home              Anticipated d/c date is: 1 day              Patient currently is not medically stable to d/c.    Severity of Illness:  The appropriate patient status for this patient is OBSERVATION. Observation status is judged to be reasonable and necessary in order to provide the required intensity of service to ensure the patient's safety. The patient's presenting symptoms, physical exam findings, and initial radiographic and laboratory data in the context of their medical condition is felt to place them at decreased risk for further clinical deterioration. Furthermore, it is anticipated that the patient will be medically stable for discharge from the hospital within 2 midnights of admission.        Date of Service 12/21/2023    Fidencio Hue [facility]   If 7PM-7AM, please contact night-coverage [email or web address] 12/21/2023, 7:24 PM

## 2023-12-21 NOTE — ED Notes (Signed)
 Pt cleaned up of incontinence of bowel. Tolerated well.

## 2023-12-22 ENCOUNTER — Other Ambulatory Visit: Payer: Self-pay

## 2023-12-22 DIAGNOSIS — E876 Hypokalemia: Secondary | ICD-10-CM | POA: Diagnosis not present

## 2023-12-22 DIAGNOSIS — A0811 Acute gastroenteropathy due to Norwalk agent: Secondary | ICD-10-CM

## 2023-12-22 DIAGNOSIS — E861 Hypovolemia: Secondary | ICD-10-CM | POA: Diagnosis present

## 2023-12-22 DIAGNOSIS — Z8249 Family history of ischemic heart disease and other diseases of the circulatory system: Secondary | ICD-10-CM | POA: Diagnosis not present

## 2023-12-22 DIAGNOSIS — E871 Hypo-osmolality and hyponatremia: Secondary | ICD-10-CM | POA: Diagnosis present

## 2023-12-22 DIAGNOSIS — R112 Nausea with vomiting, unspecified: Secondary | ICD-10-CM | POA: Diagnosis present

## 2023-12-22 DIAGNOSIS — K567 Ileus, unspecified: Secondary | ICD-10-CM | POA: Diagnosis not present

## 2023-12-22 DIAGNOSIS — K529 Noninfective gastroenteritis and colitis, unspecified: Secondary | ICD-10-CM | POA: Diagnosis present

## 2023-12-22 DIAGNOSIS — F17200 Nicotine dependence, unspecified, uncomplicated: Secondary | ICD-10-CM | POA: Diagnosis present

## 2023-12-22 LAB — BASIC METABOLIC PANEL WITH GFR
Anion gap: 8 (ref 5–15)
Anion gap: 12 (ref 5–15)
BUN: 14 mg/dL (ref 6–20)
BUN: 13 mg/dL (ref 6–20)
CO2: 26 mmol/L (ref 22–32)
CO2: 24 mmol/L (ref 22–32)
Calcium: 7.9 mg/dL — ABNORMAL LOW (ref 8.9–10.3)
Calcium: 8.2 mg/dL — ABNORMAL LOW (ref 8.9–10.3)
Chloride: 95 mmol/L — ABNORMAL LOW (ref 98–111)
Chloride: 97 mmol/L — ABNORMAL LOW (ref 98–111)
Creatinine, Ser: 0.73 mg/dL (ref 0.61–1.24)
Creatinine, Ser: 0.78 mg/dL (ref 0.61–1.24)
GFR, Estimated: >60 mL/min (ref >60)
GFR, Estimated: >60 mL/min (ref >60)
Glucose, Bld: 107 mg/dL — ABNORMAL HIGH (ref 70–99)
Glucose, Bld: 98 mg/dL (ref 70–99)
Potassium: 4 mmol/L (ref 3.5–5.1)
Potassium: 3.9 mmol/L (ref 3.5–5.1)
Sodium: 129 mmol/L — ABNORMAL LOW (ref 135–145)
Sodium: 133 mmol/L — ABNORMAL LOW (ref 135–145)

## 2023-12-22 LAB — GASTROINTESTINAL PANEL BY PCR, STOOL (REPLACES STOOL CULTURE)

## 2023-12-22 LAB — C DIFFICILE QUICK SCREEN W PCR REFLEX
C Diff antigen: NEGATIVE
C Diff interpretation: NOT DETECTED
C Diff toxin: NEGATIVE

## 2023-12-22 LAB — CBC
HCT: 36.8 % — ABNORMAL LOW (ref 39.0–52.0)
Hemoglobin: 13 g/dL (ref 13.0–17.0)
MCH: 29.3 pg (ref 26.0–34.0)
MCHC: 35.3 g/dL (ref 30.0–36.0)
MCV: 83.1 fL (ref 80.0–100.0)
Platelets: 210 10*3/uL (ref 150–400)
RBC: 4.43 MIL/uL (ref 4.22–5.81)
RDW: 13.1 % (ref 11.5–15.5)
WBC: 27 10*3/uL — ABNORMAL HIGH (ref 4.0–10.5)
nRBC: 0 % (ref 0.0–0.2)

## 2023-12-22 LAB — HIV ANTIBODY (ROUTINE TESTING W REFLEX): HIV Screen 4th Generation wRfx: NONREACTIVE

## 2023-12-22 MED ORDER — OXYCODONE HCL 5 MG PO TABS: 5.0000 mg | ORAL_TABLET | ORAL | Status: DC | PRN

## 2023-12-22 MED ORDER — POLYETHYLENE GLYCOL 3350 17 G PO PACK
17.0000 g | PACK | Freq: Two times a day (BID) | ORAL | Status: DC
  Administered 2023-12-22 – 2023-12-27 (×7): 17 g via ORAL
  Filled 2023-12-22 (×9): qty 1

## 2023-12-22 MED ORDER — OXYCODONE HCL 5 MG PO TABS
10.0000 mg | ORAL_TABLET | ORAL | Status: DC | PRN
  Administered 2023-12-22 – 2023-12-25 (×6): 10 mg via ORAL
  Filled 2023-12-22 (×6): qty 2

## 2023-12-22 MED ORDER — ACETAMINOPHEN 500 MG PO TABS
1000.0000 mg | ORAL_TABLET | Freq: Three times a day (TID) | ORAL | Status: DC
  Administered 2023-12-22 – 2023-12-26 (×9): 1000 mg via ORAL
  Filled 2023-12-22 (×11): qty 2

## 2023-12-22 MED ORDER — SODIUM CHLORIDE 0.9 % IV SOLN: INTRAVENOUS | Status: DC

## 2023-12-22 MED ORDER — MORPHINE SULFATE (PF) 4 MG/ML IV SOLN
4.0000 mg | INTRAVENOUS | Status: DC | PRN
  Administered 2023-12-22 – 2023-12-24 (×8): 4 mg via INTRAVENOUS
  Filled 2023-12-22 (×8): qty 1

## 2023-12-22 NOTE — Progress Notes (Addendum)
 Progress Note   Patient: Theodore Adams:[id number] DOB: 04/20/1986 DOA: 12/21/2023     0 DOS: the patient was seen and examined on 12/22/2023   Brief hospital course: "Theodore Adams is a 38 y.o. male with medical history significant of smoker, who presents with, nausea, vomiting, diarrhea, abdominal pain.  Patient states that his symptoms have been going on for almost a week, including nausea, vomiting, diarrhea and abdominal pain.  He has multiple episodes of nonbilious nonbloody vomiting, more than 10 times of watery diarrhea each day..." See H&P for full HPI on admission & ED course.  Patient was found to have acute colitis on CT abdomen/pelvis. He was started on IV Zosyn , IV fluids and supportive care.  Stool studies were obtained and returned positive for Norovirus infection.  Further hospital course and management as outlined below.    Assessment and Plan:  Acute infectious colitis Acute gastroenteropathy due to Norovirus CT scan showed "dilation, marked circumferential wall thickening and submucosal enhancement of the rectum with adjacent fat stranding. This extends into the descending colon in a contiguous fashion.  Differential diagnosis include inflammatory colitis and infectious colitis."   No family history of inflammatory colitis.  No recent antibiotics use.   Leukocytosis WBC 31.8, but no fever.   Lactic acid normal, clinically patient not septic on admission. --Enteric precautions --Continue IV fluids --Continue IV Zosyn  for potential secondary bacterial colitis --Clear liquid diet as tolerated --Monitor abdominal exam closely --Repeat CT scan if worsening pain or peritoneal signs & would consult general surgery --Follow blood cultures --IV Zofran  PRN for nausea vomiting - Pain control with scheduled Tylenol , PRN Oxycodone  5-10 mg, PRN IV morphine    Hyponatremia: Sodium 126, likely hypovolemic in setting of above.  Na trend 126 >> 131 after IV fluids in  the ED >> 129 this AM. --Resume NS IV fluids at 125 cc/hr --Monitor BMP   Smoker --Nicotine  patch      Subjective: Pt seen with s/o at bedside this AM.  He reports persistent abdominal pain, distention and ongoing diarrhea.  Pain so far has been difficult to control.  No fever/chills.   Physical Exam: Vitals:   12/21/23 2012 12/21/23 2109 12/22/23 0314 12/22/23 0821  BP: 130/81 116/77 118/70 106/68  Pulse: 90 85 89 78  Resp: 16 18 16 18   Temp:  98.6 F (37 C) 99.7 F (37.6 C) 99.2 F (37.3 C)  TempSrc:  Oral Oral Oral  SpO2: 99% 100% 99% 99%  Weight:      Height:       General exam: awake, alert, no acute distress, mildly ill-appearing HEENT: moist mucus membranes, hearing grossly normal  Respiratory system: CTAB, no wheezes, rales or rhonchi, normal respiratory effort. Cardiovascular system: normal S1/S2, RRR, no pedal edema.   Gastrointestinal system: abdomen is distended, tender on palpation but without rebound tenderness, positive bowel sounds Central nervous system: A&O x3. no gross focal neurologic deficits, normal speech Extremities: moves all, no edema, normal tone Skin: dry, intact, normal temperature Psychiatry: normal mood, congruent affect, judgement and insight appear normal   Data Reviewed:  Notable labs --   Na 126 >> 131 >> 129 Cl 88 >> 93 >> 95 Glucose 108 Ca 7.9 WBC 31.8 >> 27.0   Family Communication: at bedside on rounds  Disposition: Status is: Inpatient Remains inpatient appropriate because: remains on IV therapies as above   Planned Discharge Destination: Home    Time spent: 45 minutes  Author: Montey Apa, DO 12/22/2023 12:43  PM  For on call review [email or web address].

## 2023-12-22 NOTE — Plan of Care (Signed)

## 2023-12-22 NOTE — Plan of Care (Signed)

## 2023-12-23 ENCOUNTER — Inpatient Hospital Stay

## 2023-12-23 LAB — BASIC METABOLIC PANEL WITH GFR
Anion gap: 10 (ref 5–15)
BUN: 12 mg/dL (ref 6–20)
CO2: 26 mmol/L (ref 22–32)
Calcium: 8.1 mg/dL — ABNORMAL LOW (ref 8.9–10.3)
Chloride: 96 mmol/L — ABNORMAL LOW (ref 98–111)
Creatinine, Ser: 0.67 mg/dL (ref 0.61–1.24)
GFR, Estimated: >60 mL/min (ref >60)
Glucose, Bld: 99 mg/dL (ref 70–99)
Potassium: 3.9 mmol/L (ref 3.5–5.1)
Sodium: 132 mmol/L — ABNORMAL LOW (ref 135–145)

## 2023-12-23 LAB — CBC
HCT: 39.5 % (ref 39.0–52.0)
Hemoglobin: 13.4 g/dL (ref 13.0–17.0)
MCH: 29.2 pg (ref 26.0–34.0)
MCHC: 33.9 g/dL (ref 30.0–36.0)
MCV: 86.1 fL (ref 80.0–100.0)
Platelets: 261 10*3/uL (ref 150–400)
RBC: 4.59 MIL/uL (ref 4.22–5.81)
RDW: 13.5 % (ref 11.5–15.5)
WBC: 20.6 10*3/uL — ABNORMAL HIGH (ref 4.0–10.5)
nRBC: 0 % (ref 0.0–0.2)

## 2023-12-23 MED ORDER — SODIUM CHLORIDE 0.9 % IV SOLN
2.0000 g | INTRAVENOUS | Status: DC
  Administered 2023-12-23 – 2023-12-26 (×4): 2 g via INTRAVENOUS
  Filled 2023-12-23 (×4): qty 20

## 2023-12-23 MED ORDER — METRONIDAZOLE 500 MG/100ML IV SOLN
500.0000 mg | Freq: Two times a day (BID) | INTRAVENOUS | Status: DC
  Administered 2023-12-23 – 2023-12-26 (×7): 500 mg via INTRAVENOUS
  Filled 2023-12-23 (×7): qty 100

## 2023-12-23 MED ORDER — IOHEXOL 300 MG/ML  SOLN
80.0000 mL | Freq: Once | INTRAMUSCULAR | Status: AC | PRN
  Administered 2023-12-23: 80 mL via INTRAVENOUS

## 2023-12-23 MED ORDER — IOHEXOL 9 MG/ML PO SOLN
500.0000 mL | ORAL | Status: AC
  Administered 2023-12-23 (×2): 500 mL via ORAL

## 2023-12-23 NOTE — Progress Notes (Signed)
 Progress Note   Patient: Theodore Adams:[id number] DOB: 12/21/1985 DOA: 12/21/2023     1 DOS: the patient was seen and examined on 12/23/2023   Brief hospital course: "[name] is a 38 y.o. male with medical history significant of smoker, who presents with, nausea, vomiting, diarrhea, abdominal pain.  Patient states that his symptoms have been going on for almost a week, including nausea, vomiting, diarrhea and abdominal pain.  He has multiple episodes of nonbilious nonbloody vomiting, more than 10 times of watery diarrhea each day..." See H&P for full HPI on admission & ED course.  Patient was found to have acute colitis on CT abdomen/pelvis. He was started on IV Zosyn , IV fluids and supportive care.  Stool studies were obtained and returned positive for Norovirus infection.  Further hospital course and management as outlined below.    Assessment and Plan:  Acute infectious colitis Acute gastroenteropathy due to Norovirus CT scan showed "dilation, marked circumferential wall thickening and submucosal enhancement of the rectum with adjacent fat stranding. This extends into the descending colon in a contiguous fashion.  Differential diagnosis include inflammatory colitis and infectious colitis."   No family history of inflammatory colitis.  No recent antibiotics use.   Leukocytosis WBC 31.8, but no fever.   Lactic acid normal, clinically patient not septic on admission. Suspected Ileus -- repeat CT read is pending, but on my review shows severely dilated small bowel loops with air fluid levels. --Repeat CT abdomen/pelvis with contrast today --Consult General Surgery --Insert NG tube -- to low intermittent suction --NPO --Enteric precautions --Continue IV fluids --Continue IV Zosyn  for potential secondary bacterial colitis --Monitor abdominal exam closely --Follow blood cultures --IV Zofran  PRN for nausea vomiting --Pain control with scheduled Tylenol , PRN Oxycodone   5-10 mg, PRN IV morphine    Hyponatremia: Sodium 126, likely hypovolemic in setting of above.  Na trend 126 >> 131 after IV fluids in the ED >> 129 >> 133 >> 132 this AM. --On NS @ 75 cc/hr - continue --Monitor BMP   Smoker --Nicotine  patch      Subjective: Pt seen with s/o at bedside this AM.  He continues to have a lot of diarrhea.  Abdominal distention persistent, worse today than yesterday.  Pain control has been difficult despite medications.  No nausea/vomiting.  Some sweats  but no chills.  Pt drinking oral contrast for repeat CT scan.   Physical Exam: Vitals:   12/23/23 0429 12/23/23 0910 12/23/23 1338 12/23/23 1338  BP: 115/74 117/80 118/74 118/74  Pulse: 90 79 85 84  Resp: 20 17 18 18   Temp: 98.5 F (36.9 C) 98.2 F (36.8 C) 98.4 F (36.9 C) 98.4 F (36.9 C)  TempSrc: Oral Oral Oral Oral  SpO2: 98% 98% 98% 98%  Weight:      Height:       General exam: awake, alert, no acute distress, ill-appearing HEENT: moist mucus membranes, hearing grossly normal  Respiratory system: on room air, normal respiratory effort. Cardiovascular system: RRR, no pedal edema.   Gastrointestinal system: abdomen is more distended today, tender on palpation but without rebound tenderness, positive bowel sounds Central nervous system: A&O x3. no gross focal neurologic deficits, normal speech Extremities: moves all, no edema, normal tone Skin: dry, intact, normal temperature Psychiatry: normal mood, congruent affect, judgement and insight appear normal   Data Reviewed:  Notable labs --   Na 126 >> 131 >> 129 Cl 88 >> 93 >> 95 Glucose 108 Ca 7.9 WBC 31.8 >>  27.0   Family Communication: at bedside on rounds  Disposition: Status is: Inpatient Remains inpatient appropriate because: remains on IV therapies as above   Planned Discharge Destination: Home    Time spent: 45 minutes  Author: Montey Apa, DO 12/23/2023 3:41 PM  For on call review [email or web address].

## 2023-12-23 NOTE — Plan of Care (Signed)

## 2023-12-23 NOTE — TOC CM/SW Note (Addendum)
 Transition of Care [facility]) - Inpatient Brief Assessment   Patient Details  Name: Theodore Adams MRN: [id number] Date of Birth: [birth year]  Transition of Care [facility]) CM/SW Contact:    Loman Risk, RN Phone Number: 12/23/2023, 11:11 AM   Clinical Narrative:   Transition of Care [facility]) Screening Note   Patient Details  Name: Theodore Adams Date of Birth: 05-03-1986   Transition of Care [facility]) CM/SW Contact:    Loman Risk, RN Phone Number: 12/23/2023, 11:11 AM    Transition of Care Department [facility]) has reviewed patient and no TOC needs have been identified at this time. If new patient transition needs arise, please place a TOC consult.    Transition of Care Asessment: Insurance and Status: Insurance coverage has been reviewed Patient has primary care physician: Yes     Prior/Current Home Services: No current home services Social Drivers of Health Review: SDOH reviewed no interventions necessary Readmission risk has been reviewed: Yes Transition of care needs: no transition of care needs at this time

## 2023-12-24 LAB — COMPREHENSIVE METABOLIC PANEL WITH GFR
ALT: 21 U/L (ref 0–44)
AST: 20 U/L (ref 15–41)
Albumin: 1.8 g/dL — ABNORMAL LOW (ref 3.5–5.0)
Alkaline Phosphatase: 80 U/L (ref 38–126)
Anion gap: 11 (ref 5–15)
BUN: 8 mg/dL (ref 6–20)
CO2: 25 mmol/L (ref 22–32)
Calcium: 7.8 mg/dL — ABNORMAL LOW (ref 8.9–10.3)
Chloride: 97 mmol/L — ABNORMAL LOW (ref 98–111)
Creatinine, Ser: 0.65 mg/dL (ref 0.61–1.24)
GFR, Estimated: >60 mL/min (ref >60)
Glucose, Bld: 96 mg/dL (ref 70–99)
Potassium: 3.8 mmol/L (ref 3.5–5.1)
Sodium: 133 mmol/L — ABNORMAL LOW (ref 135–145)
Total Bilirubin: 1.2 mg/dL (ref 0.0–1.2)
Total Protein: 5.2 g/dL — ABNORMAL LOW (ref 6.5–8.1)

## 2023-12-24 LAB — CBC WITH DIFFERENTIAL/PLATELET
Abs Immature Granulocytes: 1.27 10*3/uL — ABNORMAL HIGH (ref 0.00–0.07)
Basophils Absolute: 0 10*3/uL (ref 0.0–0.1)
Basophils Relative: 0 %
Eosinophils Absolute: 0.1 10*3/uL (ref 0.0–0.5)
Eosinophils Relative: 1 %
HCT: 36.3 % — ABNORMAL LOW (ref 39.0–52.0)
Hemoglobin: 12.5 g/dL — ABNORMAL LOW (ref 13.0–17.0)
Immature Granulocytes: 8 %
Lymphocytes Relative: 9 %
Lymphs Abs: 1.5 10*3/uL (ref 0.7–4.0)
MCH: 29.1 pg (ref 26.0–34.0)
MCHC: 34.4 g/dL (ref 30.0–36.0)
MCV: 84.6 fL (ref 80.0–100.0)
Monocytes Absolute: 1.1 10*3/uL — ABNORMAL HIGH (ref 0.1–1.0)
Monocytes Relative: 7 %
Neutro Abs: 12.8 10*3/uL — ABNORMAL HIGH (ref 1.7–7.7)
Neutrophils Relative %: 75 %
Platelets: 364 10*3/uL (ref 150–400)
RBC: 4.29 MIL/uL (ref 4.22–5.81)
RDW: 13.1 % (ref 11.5–15.5)
Smear Review: NORMAL
WBC: 16.8 10*3/uL — ABNORMAL HIGH (ref 4.0–10.5)
nRBC: 0 % (ref 0.0–0.2)

## 2023-12-24 NOTE — Plan of Care (Signed)
  Problem: Education: Goal: Knowledge of General Education information will improve Description: Including pain rating scale, medication(s)/side effects and non-pharmacologic comfort measures Outcome: Progressing   Problem: Health Behavior/Discharge Planning: Goal: Ability to manage health-related needs will improve Outcome: Progressing   Problem: Clinical Measurements: Goal: Ability to maintain clinical measurements within normal limits will improve Outcome: Progressing Goal: Respiratory complications will improve Outcome: Progressing Goal: Cardiovascular complication will be avoided Outcome: Progressing   Problem: Activity: Goal: Risk for activity intolerance will decrease Outcome: Progressing   Problem: Coping: Goal: Level of anxiety will decrease Outcome: Progressing   Problem: Elimination: Goal: Will not experience complications related to bowel motility Outcome: Progressing Goal: Will not experience complications related to urinary retention Outcome: Progressing   Problem: Pain Managment: Goal: General experience of comfort will improve and/or be controlled Outcome: Progressing   Problem: Safety: Goal: Ability to remain free from injury will improve Outcome: Progressing   Problem: Skin Integrity: Goal: Risk for impaired skin integrity will decrease Outcome: Progressing

## 2023-12-24 NOTE — Plan of Care (Signed)

## 2023-12-24 NOTE — Consult Note (Signed)
 [facility]-General Surgery  SURGICAL CONSULTATION NOTE    HISTORY OF PRESENT ILLNESS (HPI):  38 y.o. male presented to [facility] ED for evaluation of emesis and diarrhea on 12/21/23.  Patient had been experiencing lower abdominal pain and diarrhea over 7 days.  He estimated to have had about 10-15 episodes of loose stool per day, with some dizziness and weakness.  He reported multiple episodes of nonbilious and nonbloody vomiting.  No known alleviating or aggravating factors. CT of abdomen/pelvis showed acute colitis. Patient was admitted by internal medicine.Stool studies were obtained and returned positive for Norovirus infection on 12/22/23. Repeated CT scan showed an increase in small bowel dilatation. Leukocytosis at admission 31.8 and now 16.8. Patient denies any blood in stool.  He reports having more of a solid stool today. Denies experiencing any flatulence. Denies any fevers or chills.   Surgery is consulted by  physician Dr. Antoniette Batty with internal medicine in this context for evaluation and management of suspected ileus likely secondary to acute infectious colitis.  PAST MEDICAL HISTORY (PMH):  Past Medical History:  Diagnosis Date   Smoker      PAST SURGICAL HISTORY (PSH):  History reviewed. No pertinent surgical history.   MEDICATIONS:  Prior to Admission medications   Medication Sig Start Date End Date Taking? Authorizing Provider  mupirocin  ointment (BACTROBAN ) 2 % Apply 1 application topically 3 (three) times daily. 06/26/18   Georgiann Kirsch, PA-C     ALLERGIES:  No Known Allergies   SOCIAL HISTORY:  Social History   Socioeconomic History   Marital status: Single    Spouse name: Not on file   Number of children: Not on file   Years of education: Not on file   Highest education level: Not on file  Occupational History   Not on file  Tobacco Use   Smoking status: Every Day   Smokeless tobacco: Never  Vaping Use   Vaping status: Never Used  Substance and  Sexual Activity   Alcohol use: Never   Drug use: Never   Sexual activity: Not on file  Other Topics Concern   Not on file  Social History Narrative   Not on file   Social Drivers of Health   Financial Resource Strain: Not on file  Food Insecurity: No Food Insecurity (12/21/2023)   Hunger Vital Sign    Worried About Running Out of Food in the Last Year: Never true    Ran Out of Food in the Last Year: Never true  Transportation Needs: No Transportation Needs (12/21/2023)   PRAPARE - Administrator, Civil Service (Medical): No    Lack of Transportation (Non-Medical): No  Physical Activity: Not on file  Stress: Not on file  Social Connections: Not on file  Intimate Partner Violence: Not At Risk (12/21/2023)   Humiliation, Afraid, Rape, and Kick questionnaire    Fear of Current or Ex-Partner: No    Emotionally Abused: No    Physically Abused: No    Sexually Abused: No     FAMILY HISTORY:  Family History  Problem Relation Age of Onset   Heart attack Mother    Heart attack Father       REVIEW OF SYSTEMS:  Review of Systems  Constitutional:  Negative for chills and fever.  Respiratory:  Negative for shortness of breath and wheezing.   Cardiovascular:  Negative for chest pain and palpitations.  Gastrointestinal:  Positive for abdominal pain, diarrhea, nausea and vomiting. Negative for blood in stool.  VITAL SIGNS:  Temp:  [98.2 F (36.8 C)-99.6 F (37.6 C)] 99.1 F (37.3 C) (05/20 0452) Pulse Rate:  [79-98] 85 (05/20 0452) Resp:  [12-18] 12 (05/20 0452) BP: (117-131)/(65-80) 124/65 (05/20 0452) SpO2:  [98 %-99 %] 99 % (05/20 0452)     Height: 5\' 6"  (167.6 cm) Weight: 61.2 kg BMI (Calculated): 21.8   INTAKE/OUTPUT:  05/19 0701 - 05/20 0700 In: 120 [P.O.:120] Out: 2000 [Urine:1450; Emesis/NG output:550]  PHYSICAL EXAM:  Physical Exam Constitutional:      Appearance: Normal appearance.  HENT:     Head: Normocephalic and atraumatic.  Cardiovascular:      Rate and Rhythm: Normal rate and regular rhythm.  Pulmonary:     Effort: Pulmonary effort is normal.     Breath sounds: Normal breath sounds.  Abdominal:     General: There is distension.     Tenderness: There is no abdominal tenderness. There is no guarding or rebound.  Neurological:     Mental Status: He is alert.      Labs:     Latest Ref Rng & Units 12/24/2023    3:48 AM 12/23/2023    4:57 AM 12/22/2023    4:51 AM  CBC  WBC 4.0 - 10.5 K/uL 16.8  20.6  27.0   Hemoglobin 13.0 - 17.0 g/dL 16.1  09.6  04.5   Hematocrit 39.0 - 52.0 % 36.3  39.5  36.8   Platelets 150 - 400 K/uL 364  261  210       Latest Ref Rng & Units 12/24/2023    3:48 AM 12/23/2023    4:57 AM 12/22/2023    4:38 PM  CMP  Glucose 70 - 99 mg/dL 96  99  98   BUN 6 - 20 mg/dL 8  12  13    Creatinine 0.61 - 1.24 mg/dL 4.09  8.11  9.14   Sodium 135 - 145 mmol/L 133  132  133   Potassium 3.5 - 5.1 mmol/L 3.8  3.9  3.9   Chloride 98 - 111 mmol/L 97  96  97   CO2 22 - 32 mmol/L 25  26  24    Calcium 8.9 - 10.3 mg/dL 7.8  8.1  8.2   Total Protein 6.5 - 8.1 g/dL 5.2     Total Bilirubin 0.0 - 1.2 mg/dL 1.2     Alkaline Phos 38 - 126 U/L 80     AST 15 - 41 U/L 20     ALT 0 - 44 U/L 21        Imaging studies:  EXAM: 12/21/23 CT ABDOMEN AND PELVIS WITH CONTRAST   TECHNIQUE: Multidetector CT imaging of the abdomen and pelvis was performed using the standard protocol following bolus administration of intravenous contrast.   RADIATION DOSE REDUCTION: This exam was performed according to the departmental dose-optimization program which includes automated exposure control, adjustment of the mA and/or kV according to patient size and/or use of iterative reconstruction technique.   CONTRAST:  80mL OMNIPAQUE  IOHEXOL  300 MG/ML  SOLN   COMPARISON:  None Available.   FINDINGS: Lower chest: No acute abnormality.   Hepatobiliary: No focal liver abnormality is seen. No gallstones, gallbladder wall thickening, or  biliary dilatation.   Pancreas: Unremarkable. No pancreatic ductal dilatation or surrounding inflammatory changes.   Spleen: Normal in size without focal abnormality.   Adrenals/Urinary Tract: Adrenal glands are unremarkable. Kidneys are normal, without renal calculi, focal lesion, or hydronephrosis. Bladder is unremarkable.   Stomach/Bowel: There is  dilation of the distal colon with marked circumferential wall thickening and submucosal enhancement of the rectum. There is adjacent fat stranding in the perirectal fat. Submucosal enhancement and wall thickening extends into the sigmoid colon and descending colon. These portions of the colon are relatively featureless in appearance. There is moderate upstream colonic stool volume. Appendix is normal. Stomach is unremarkable for degree of distension. No definitive small bowel obstruction although several loops of small bowel are prominent in appearance.   Vascular/Lymphatic: Aorta is normal in course and caliber. Prominent mesenteric venous vasculature. No definitive lymphadenopathy identified.   Reproductive: Prostate is unremarkable.   Other: No free air.   Musculoskeletal: No acute or significant osseous findings.   IMPRESSION: 1. There is dilation, marked circumferential wall thickening and submucosal enhancement of the rectum with adjacent fat stranding. This extends into the descending colon in a contiguous fashion. Findings are favored to reflect an inflammatory colitis such as ulcerative colitis although infectious colitis remains in the differential. Recommend correlation with laboratory values and consideration of colonoscopy when clinically appropriate.  EXAM: 12/23/23 CT ABDOMEN AND PELVIS WITH CONTRAST   TECHNIQUE: Multidetector CT imaging of the abdomen and pelvis was performed using the standard protocol following bolus administration of intravenous contrast.   RADIATION DOSE REDUCTION: This exam was  performed according to the departmental dose-optimization program which includes automated exposure control, adjustment of the mA and/or kV according to patient size and/or use of iterative reconstruction technique.   CONTRAST:  80mL OMNIPAQUE  IOHEXOL  300 MG/ML  SOLN   COMPARISON:  12/21/2023   FINDINGS: Lower chest: Trace bilateral pleural effusions. No confluent airspace opacities. Wall thickening in the distal esophagus suggesting esophagitis.   Hepatobiliary: No focal hepatic abnormality. Gallbladder unremarkable.   Pancreas: No focal abnormality or ductal dilatation.   Spleen: No focal abnormality.  Normal size.   Adrenals/Urinary Tract: No adrenal abnormality. No focal renal abnormality. No stones or hydronephrosis. Urinary bladder is unremarkable.   Stomach/Bowel: Continued wall thickening and mucosal enhancement involving the rectum and extending into the lower sigmoid colon. Significant upstream stool burden and gaseous distention of the colon, with increasing small bowel dilatation. Small bowel loops measure up 2 4.4 cm in diameter, many of which are fluid-filled with scattered air-fluid levels. Is difficult to identify the terminal ileum given the degree of colonic and small bowel distension.   Vascular/Lymphatic: No evidence of aneurysm or adenopathy.   Reproductive: No visible focal abnormality.   Other: No free fluid or free air.   Musculoskeletal: No acute bony abnormality.   IMPRESSION: Continued wall thickening in the rectum and distal sigmoid colon with significant upstream gaseous distention of the colon and now diffusely throughout the small bowel. Small bowel measures up to 4.4 cm in diameter. Findings concerning for distal colonic colitis, infectious versus inflammatory. The diffuse gaseous distention of bowel suggest significant ileus.   Wall thickening in the distal esophagus suggests esophagitis.   Trace bilateral pleural  effusions.   Assessment/Plan:  38 y.o. male with ileus secondary to infectious colitis due to Norovirus, with medical history of significant back of smoker.   - No fever or tachycardia with improving leukocytosis  - Abdominal distention improving  - Continue NG tube, on low to intermittent suction  - Continue NPO  - Continue IV fluids and pain management  - DVT prophylaxis  -- Theodore Malinoski Barrientos PA-C

## 2023-12-24 NOTE — Progress Notes (Signed)
 Progress Note   Patient: Theodore Adams ZOX:[id number] DOB: [birth year] DOA: 12/21/2023     2 DOS: the patient was seen and examined on 12/24/2023   Brief hospital course: "KORVER GRAYBEAL is a 38 y.o. male with medical history significant of smoker, who presents with, nausea, vomiting, diarrhea, abdominal pain.  Patient states that his symptoms have been going on for almost a week, including nausea, vomiting, diarrhea and abdominal pain.  He has multiple episodes of nonbilious nonbloody vomiting, more than 10 times of watery diarrhea each day..." See H&P for full HPI on admission & ED course.  Patient was found to have acute colitis on CT abdomen/pelvis. He was started on IV Zosyn , IV fluids and supportive care.  Stool studies were obtained and returned positive for Norovirus infection.  Further hospital course and management as outlined below.    Assessment and Plan:  Acute infectious colitis Acute gastroenteropathy due to Norovirus CT scan showed "dilation, marked circumferential wall thickening and submucosal enhancement of the rectum with adjacent fat stranding. This extends into the descending colon in a contiguous fashion.  Differential diagnosis include inflammatory colitis and infectious colitis."   No family history of inflammatory colitis.  No recent antibiotics use.   Leukocytosis WBC 31.8, but no fever.   Lactic acid normal, clinically patient not septic on admission. Ileus -- repeat CT 5/19 with increased distention, now severely dilated small bowel loops with air fluid levels. --General Surgery consulted --NG tube to low intermittent suction --NPO --Enteric precautions --Continue IV fluids, change to D5-NS while NPO --Continue IV Zosyn  for potential secondary bacterial colitis --Consider inflammatory etiology (UC/Crohns) if not improving.  Pt denies chronic GI symptoms or family history. --Monitor abdominal exam closely --Follow blood cultures --IV Zofran  PRN for  nausea vomiting --Pain control with scheduled Tylenol , PRN Oxycodone  5-10 mg, PRN IV morphine    Hyponatremia: Sodium 126, likely hypovolemic in setting of above.  Na 133 this AM. --Change NS to NS-D5 @ 75 cc/hr since NPO  --Monitor BMP   Smoker --Nicotine  patch      Subjective: Pt seen with s/o at bedside this AM.  He reports diarrhea less frequent and smaller amounts.  Still having abdominal pain, no nausea/vomiting.  Asks if he can have ice chips.   Physical Exam: Vitals:   12/23/23 1338 12/23/23 2042 12/24/23 0452 12/24/23 0905  BP: 118/74 131/67 124/65 122/70  Pulse: 84 98 85 91  Resp: 18 12 12 17   Temp: 98.4 F (36.9 C) 99.6 F (37.6 C) 99.1 F (37.3 C) 99.4 F (37.4 C)  TempSrc: Oral Oral Oral Oral  SpO2: 98% 98% 99% 99%  Weight:      Height:       General exam: awake, alert, no acute distress, ill-appearing HEENT: moist mucus membranes, hearing grossly normal NG tube in place to wall suction Respiratory system: on room air, normal respiratory effort. Cardiovascular system: RRR, no pedal edema.   Gastrointestinal system: persistent distention slightly improved today, less tender on palpation, bowel sounds present Central nervous system: A&O x3. no gross focal neurologic deficits, normal speech Extremities: moves all, no edema, normal tone Skin: dry, intact, normal temperature Psychiatry: normal mood, congruent affect, judgement and insight appear normal   Data Reviewed:  Notable labs --   Na 126 >> 131 >> 129 >> 133 Cl 88 >> 93 >> 95 >> 97 Ca 7.8 Albumin 1.8 WBC 31.8 >> 27.0 >> 20.6 >> 16.8   Family Communication: at bedside on rounds  Disposition: Status  is: Inpatient Remains inpatient appropriate because: remains on IV therapies as above, ileus with NG tube in place, NPO   Planned Discharge Destination: Home    Time spent: 42 minutes  Author: Montey Apa, DO 12/24/2023 3:58 PM  For on call review [email or web address].

## 2023-12-25 ENCOUNTER — Inpatient Hospital Stay

## 2023-12-25 LAB — BASIC METABOLIC PANEL WITH GFR
Anion gap: 9 (ref 5–15)
BUN: 9 mg/dL (ref 6–20)
CO2: 26 mmol/L (ref 22–32)
Calcium: 7.7 mg/dL — ABNORMAL LOW (ref 8.9–10.3)
Chloride: 99 mmol/L (ref 98–111)
Creatinine, Ser: 0.61 mg/dL (ref 0.61–1.24)
GFR, Estimated: >60 mL/min (ref >60)
Glucose, Bld: 113 mg/dL — ABNORMAL HIGH (ref 70–99)
Potassium: 3.3 mmol/L — ABNORMAL LOW (ref 3.5–5.1)
Sodium: 134 mmol/L — ABNORMAL LOW (ref 135–145)

## 2023-12-25 LAB — CBC
HCT: 34.2 % — ABNORMAL LOW (ref 39.0–52.0)
Hemoglobin: 11.8 g/dL — ABNORMAL LOW (ref 13.0–17.0)
MCH: 29.5 pg (ref 26.0–34.0)
MCHC: 34.5 g/dL (ref 30.0–36.0)
MCV: 85.5 fL (ref 80.0–100.0)
Platelets: 463 10*3/uL — ABNORMAL HIGH (ref 150–400)
RBC: 4 MIL/uL — ABNORMAL LOW (ref 4.22–5.81)
RDW: 13 % (ref 11.5–15.5)
WBC: 9.9 10*3/uL (ref 4.0–10.5)
nRBC: 0 % (ref 0.0–0.2)

## 2023-12-25 LAB — PHOSPHORUS: Phosphorus: 2.5 mg/dL (ref 2.5–4.6)

## 2023-12-25 LAB — MAGNESIUM: Magnesium: 1.8 mg/dL (ref 1.7–2.4)

## 2023-12-25 MED ORDER — ENOXAPARIN SODIUM 40 MG/0.4ML IJ SOSY
40.0000 mg | PREFILLED_SYRINGE | INTRAMUSCULAR | Status: DC
  Administered 2023-12-25 – 2023-12-27 (×3): 40 mg via SUBCUTANEOUS
  Filled 2023-12-25 (×4): qty 0.4

## 2023-12-25 MED ORDER — DIATRIZOATE MEGLUMINE & SODIUM 66-10 % PO SOLN
90.0000 mL | Freq: Once | ORAL | Status: AC
  Administered 2023-12-25: 90 mL via NASOGASTRIC

## 2023-12-25 NOTE — Care Management Important Message (Signed)
 Important Message  Patient Details  Name: Theodore Adams MRN: [id number] Date of Birth: 18-Jun-1986   Important Message Given:  Yes - Medicare IM     Shuntae Herzig W, CMA 12/25/2023, 1:20 PM

## 2023-12-25 NOTE — Progress Notes (Signed)
  PROGRESS NOTE    YANG RACK  ZOX:[id number] DOB: 1986-03-04 DOA: 12/21/2023 PCP: [facility]  [id number]/[id number]-AA  LOS: 3 days   Brief hospital course:   Assessment & Plan: Theodore Adams is a 38 y.o. male with medical history significant of smoker, who presents with, nausea, vomiting, diarrhea, abdominal pain.  Patient states that his symptoms have been going on for almost a week, including nausea, vomiting, diarrhea and abdominal pain.  He has multiple episodes of nonbilious nonbloody vomiting, more than 10 times of watery diarrhea each day.  Patient was found to have acute colitis on CT abdomen/pelvis. He was started on IV Zosyn , IV fluids and supportive care.  Stool studies were obtained and returned positive for Norovirus infection.  Pt also developed ileus.  Acute infectious colitis Acute gastroenteropathy due to Norovirus CT scan showed "dilation, marked circumferential wall thickening and submucosal enhancement of the rectum with adjacent fat stranding. This extends into the descending colon in a contiguous fashion.  Differential diagnosis include inflammatory colitis and infectious colitis."   No family history of inflammatory colitis.  No recent antibiotics use.   --cont empiric ceftriaxone  and flagyl   Ileus  -- repeat CT 5/19 with increased distention, now severely dilated small bowel loops with air fluid levels.  Most likely reactive to current infection, but also noted significant use of opioids pain meds. --General Surgery consulted --cont NG tube  --cont MIVF --minimize opioids  Hyponatremia:  Sodium 126, likely hypovolemic in setting of above.  Improved with IVF --cont MIVF   Smoker --Nicotine  patch  Significant stool burden --cont Miralax    DVT prophylaxis: Lovenox SQ Code Status: Full code  Family Communication: sister and significant other updated at bedside Level of care: Med-Surg Dispo:   The patient is from: home Anticipated  d/c is to: home Anticipated d/c date is: > 3 days   Subjective and Interval History:  Pt reported still having diarrhea.  Requested opioids for abdominal pain.   Objective: Vitals:   12/25/23 0345 12/25/23 0824 12/25/23 1617 12/25/23 2013  BP: (!) 106/57 113/66 (!) 118/59 114/69  Pulse: 91 95 81 76  Resp: 18 17 17 18   Temp: 99.7 F (37.6 C) 100.1 F (37.8 C) 98.3 F (36.8 C) (!) 100.4 F (38 C)  TempSrc: Axillary  Oral Oral  SpO2: 99% 97% 98% 98%  Weight:      Height:        Intake/Output Summary (Last 24 hours) at 12/25/2023 2101 Last data filed at 12/25/2023 1336 Gross per 24 hour  Intake 1775.94 ml  Output --  Net 1775.94 ml   Filed Weights   12/21/23 1644  Weight: 61.2 kg    Examination:   Constitutional: NAD, sleepy but arousable, oriented HEENT: conjunctivae and lids normal, EOMI CV: No cyanosis.   RESP: normal respiratory effort, on RA Abdomen: NG tube present   Data Reviewed: I have personally reviewed labs and imaging studies  Time spent: 50 minutes  Garrison Kanner, MD [facility] If 7PM-7AM, please contact night-coverage 12/25/2023, 9:01 PM

## 2023-12-25 NOTE — Progress Notes (Signed)
 [facility]- General Surgery  SURGICAL PROGRESS NOTE  Hospital Day(s): 3.   HISTORY OF PRESENT ILLNESS (HPI):  38 y.o. male presented to [facility] ED for evaluation of emesis and diarrhea on 12/21/23.  Patient had been experiencing lower abdominal pain and diarrhea over 7 days.  He estimated to have had about 10-15 episodes of loose stool per day, with some dizziness and weakness.  He reported multiple episodes of nonbilious and nonbloody vomiting.  No known alleviating or aggravating factors. CT of abdomen/pelvis showed acute colitis. Patient was admitted by internal medicine.Stool studies were obtained and returned positive for Norovirus infection on 12/22/23. Repeated CT scan showed an increase in small bowel dilatation. Leukocytosis at admission 31.8 and now 16.8. Patient denies any blood in stool.  He reports having more of a solid stool today. Denies experiencing any flatulence. Denies any fevers or chills.  Interval History:  Patient seen and examine. No acute events or new complaints overnight.  Patient reports feeling well.He reports still having diarrhea. Denies any abdominal pain. Still distended. Denies any nausea or vomiting.    Vital signs in last 24 hours: [min-max] current  Temp:  [99.5 F (37.5 C)-100.1 F (37.8 C)] 100.1 F (37.8 C) (05/21 0824) Pulse Rate:  [91-101] 95 (05/21 0824) Resp:  [17-20] 17 (05/21 0824) BP: (106-113)/(57-67) 113/66 (05/21 0824) SpO2:  [97 %-99 %] 97 % (05/21 0824)     Height: 5\' 6"  (167.6 cm) Weight: 61.2 kg BMI (Calculated): 21.8   Intake/Output last 2 shifts:  05/20 0701 - 05/21 0700 In: 2690.9 [I.V.:2190.9; IV Piggyback:500] Out: 1400 [Urine:600; Emesis/NG output:800]   Physical Exam:  Constitutional: alert, cooperative and no distress  Respiratory: breathing non-labored at rest  Cardiovascular: regular rate and sinus rhythm  Gastrointestinal: soft, non-tender, and still distended  Labs:     Latest Ref Rng & Units 12/25/2023    4:44  AM 12/24/2023    3:48 AM 12/23/2023    4:57 AM  CBC  WBC 4.0 - 10.5 K/uL 9.9  16.8  20.6   Hemoglobin 13.0 - 17.0 g/dL 69.6  29.5  28.4   Hematocrit 39.0 - 52.0 % 34.2  36.3  39.5   Platelets 150 - 400 K/uL 463  364  261       Latest Ref Rng & Units 12/25/2023    4:44 AM 12/24/2023    3:48 AM 12/23/2023    4:57 AM  CMP  Glucose 70 - 99 mg/dL 132  96  99   BUN 6 - 20 mg/dL 9  8  12    Creatinine 0.61 - 1.24 mg/dL 4.40  1.02  7.25   Sodium 135 - 145 mmol/L 134  133  132   Potassium 3.5 - 5.1 mmol/L 3.3  3.8  3.9   Chloride 98 - 111 mmol/L 99  97  96   CO2 22 - 32 mmol/L 26  25  26    Calcium 8.9 - 10.3 mg/dL 7.7  7.8  8.1   Total Protein 6.5 - 8.1 g/dL  5.2    Total Bilirubin 0.0 - 1.2 mg/dL  1.2    Alkaline Phos 38 - 126 U/L  80    AST 15 - 41 U/L  20    ALT 0 - 44 U/L  21      Imaging studies:  X-ray of abdomen completed - narrative and impression of results pending   Assessment/Plan:  38 y.o. male with ileus secondary to infectious colitis due to Norovirus, with medical  history of significant back of smoker.   - No change in abdominal distention. Abdominal x-ray from today continues to show dilatation of the small and large intestine, not worsening and not improving.   - Leukocytosis resolved, 16.8 >> 9.9 - Ordered gastrografin for diagnostic and therapeutic   - Continue NGT in place, clamp for two hour with gastrografin  - Continue to hydrate patient with IV fluids  - Continue pain management   -  Encouraged to ambulate   -- Kyandre Okray Barrientos PA-C

## 2023-12-25 NOTE — Plan of Care (Signed)

## 2023-12-26 ENCOUNTER — Inpatient Hospital Stay

## 2023-12-26 LAB — BASIC METABOLIC PANEL WITH GFR
Anion gap: 6 (ref 5–15)
BUN: 9 mg/dL (ref 6–20)
CO2: 25 mmol/L (ref 22–32)
Calcium: 7.4 mg/dL — ABNORMAL LOW (ref 8.9–10.3)
Chloride: 104 mmol/L (ref 98–111)
Creatinine, Ser: 0.64 mg/dL (ref 0.61–1.24)
GFR, Estimated: >60 mL/min (ref >60)
Glucose, Bld: 87 mg/dL (ref 70–99)
Potassium: 3.4 mmol/L — ABNORMAL LOW (ref 3.5–5.1)
Sodium: 135 mmol/L (ref 135–145)

## 2023-12-26 LAB — CULTURE, BLOOD (ROUTINE X 2)
Culture: NO GROWTH
Culture: NO GROWTH

## 2023-12-26 LAB — MAGNESIUM: Magnesium: 1.8 mg/dL (ref 1.7–2.4)

## 2023-12-26 MED ORDER — OXYCODONE HCL 5 MG PO TABS: 5.0000 mg | ORAL_TABLET | ORAL | Status: DC | PRN

## 2023-12-26 NOTE — Progress Notes (Signed)
 [facility]- General Surgery  SURGICAL PROGRESS NOTE  Hospital Day(s): 4.   HISTORY OF PRESENT ILLNESS (HPI):  38 y.o. male presented to [facility] ED for evaluation of emesis and diarrhea on 12/21/23.  Patient had been experiencing lower abdominal pain and diarrhea over 7 days.  He estimated to have had about 10-15 episodes of loose stool per day, with some dizziness and weakness.  He reported multiple episodes of nonbilious and nonbloody vomiting.  No known alleviating or aggravating factors. CT of abdomen/pelvis showed acute colitis. Patient was admitted by internal medicine.Stool studies were obtained and returned positive for Norovirus infection on 12/22/23. Repeated CT scan showed an increase in small bowel dilatation. Leukocytosis at admission 31.8 and now 16.8. Patient denies any blood in stool.  He reports having more of a solid stool today. Denies experiencing any flatulence. Denies any fevers or chills.  Interval History:  Patient seen and examined. No acute events or new complaints overnight. There has been no significant clinical changes.Patient still having bowel movements. Feels like his symptoms are improving.  Abdominal x-ray after giving gastrografin still shows distention of small and large intestine, contrast showing within small bowel. Difficult to distinguish between small vs large intestine in some area of x-ray. NGT has advanced upward and is no longer in the stomach.    Vital signs in last 24 hours: [min-max] current  Temp:  [98.3 F (36.8 C)-100.4 F (38 C)] 99.3 F (37.4 C) (05/22 0330) Pulse Rate:  [76-95] 90 (05/22 0330) Resp:  [17-18] 18 (05/22 0330) BP: (110-118)/(50-69) 110/50 (05/22 0330) SpO2:  [97 %-99 %] 99 % (05/22 0330)     Height: 5\' 6"  (167.6 cm) Weight: 61.2 kg BMI (Calculated): 21.8   Intake/Output last 2 shifts:  05/21 0701 - 05/22 0700 In: 1775.9 [I.V.:1475.9; IV Piggyback:300] Out: 500 [Urine:500]   Physical Exam:  Constitutional: alert,  cooperative and no distress  Respiratory: breathing non-labored at rest  Cardiovascular: regular rate and sinus rhythm  Gastrointestinal: soft, non-tender, and still distended but improving    Labs:     Latest Ref Rng & Units 12/25/2023    4:44 AM 12/24/2023    3:48 AM 12/23/2023    4:57 AM  CBC  WBC 4.0 - 10.5 K/uL 9.9  16.8  20.6   Hemoglobin 13.0 - 17.0 g/dL 01.0  27.2  53.6   Hematocrit 39.0 - 52.0 % 34.2  36.3  39.5   Platelets 150 - 400 K/uL 463  364  261       Latest Ref Rng & Units 12/26/2023    6:05 AM 12/25/2023    4:44 AM 12/24/2023    3:48 AM  CMP  Glucose 70 - 99 mg/dL 87  644  96   BUN 6 - 20 mg/dL 9  9  8    Creatinine 0.61 - 1.24 mg/dL 0.34  7.42  5.95   Sodium 135 - 145 mmol/L 135  134  133   Potassium 3.5 - 5.1 mmol/L 3.4  3.3  3.8   Chloride 98 - 111 mmol/L 104  99  97   CO2 22 - 32 mmol/L 25  26  25    Calcium 8.9 - 10.3 mg/dL 7.4  7.7  7.8   Total Protein 6.5 - 8.1 g/dL   5.2   Total Bilirubin 0.0 - 1.2 mg/dL   1.2   Alkaline Phos 38 - 126 U/L   80   AST 15 - 41 U/L   20   ALT 0 -  44 U/L   21      Imaging studies:   EXAM:12/25/23 PORTABLE ABDOMEN - 1 VIEW   COMPARISON:  Film from earlier in the same day   FINDINGS: Gastric catheter remains in the distal esophagus. This should be advanced several cm deeper into the stomach. Administered contrast lies predominately throughout the dilated small bowel. No significant colonic contrast is noted at this time. No free air is seen.   IMPRESSION: Stable small bowel dilatation. The prominent amount of contrast lies within the small bowel. 24 hour follow-up is recommended.     Assessment/Plan:  38 y.o. male with ileus secondary to infectious colitis due to Norovirus, with medical history of significant back of smoker.    - Ordered 2-view abdominal x-ray for a better view of small vs large intestine  - Advanced NG tube down into the stomach this morning as it had advanced upward  - Continue NGT until  further improvement of abdominal distention  - Continue to hydrate patient with IV fluids   - Continue pain management and encourage to ambulate    -- Byren Pankow Barrientos PA-C

## 2023-12-26 NOTE — Plan of Care (Signed)
  Problem: Education: Goal: Knowledge of General Education information will improve Description: Including pain rating scale, medication(s)/side effects and non-pharmacologic comfort measures Outcome: Progressing   Problem: Coping: Goal: Level of anxiety will decrease Outcome: Progressing   Problem: Elimination: Goal: Will not experience complications related to bowel motility Outcome: Progressing Goal: Will not experience complications related to urinary retention Outcome: Progressing   Problem: Pain Managment: Goal: General experience of comfort will improve and/or be controlled Outcome: Progressing   Problem: Safety: Goal: Ability to remain free from injury will improve Outcome: Progressing   Problem: Skin Integrity: Goal: Risk for impaired skin integrity will decrease Outcome: Progressing

## 2023-12-26 NOTE — Plan of Care (Signed)

## 2023-12-26 NOTE — Progress Notes (Signed)
  PROGRESS NOTE    Theodore Adams  RUE:[id number] DOB: [birth year] DOA: 12/21/2023 PCP: [facility]  213A/213A-AA  LOS: 4 days   Brief hospital course:   Assessment & Plan: Theodore Adams is a 38 y.o. male with medical history significant of smoker, who presents with, nausea, vomiting, diarrhea, abdominal pain.  Patient states that his symptoms have been going on for almost a week, including nausea, vomiting, diarrhea and abdominal pain.  He has multiple episodes of nonbilious nonbloody vomiting, more than 10 times of watery diarrhea each day.  Patient was found to have acute colitis on CT abdomen/pelvis. He was started on IV Zosyn , IV fluids and supportive care.  Stool studies were obtained and returned positive for Norovirus infection.  Pt also developed ileus.  Acute infectious colitis Acute gastroenteropathy due to Norovirus CT scan showed "dilation, marked circumferential wall thickening and submucosal enhancement of the rectum with adjacent fat stranding. This extends into the descending colon in a contiguous fashion.  Differential diagnosis include inflammatory colitis and infectious colitis."   No family history of inflammatory colitis.  No recent antibiotics use.   --d/c empiric ceftriaxone  and flagyl  today  Ileus  -- repeat CT 5/19 with increased distention, now severely dilated small bowel loops with air fluid levels.  Most likely reactive to current infection, but also noted significant use of opioids pain meds. --General Surgery consulted --cont NGT to low intermittent suction --cont MIVF --minimize opioids  Hyponatremia:  Sodium 126, likely hypovolemic in setting of above.  Improved with IVF --cont MIVF   Smoker --Nicotine  patch  Significant stool burden --cont Miralax    DVT prophylaxis: Lovenox SQ Code Status: Full code  Family Communication: sister and significant other updated at bedside Level of care: Med-Surg Dispo:   The patient  is from: home Anticipated d/c is to: home Anticipated d/c date is: > 3 days   Subjective and Interval History:  Pt reported abdominal pain improved.  Had liquid BM's.   Objective: Vitals:   12/26/23 0758 12/26/23 1500 12/26/23 1623 12/26/23 2057  BP: 117/62 115/65 115/65 114/62  Pulse: 91 86 86 84  Resp: 18 18 18 16   Temp: 99.2 F (37.3 C) 97.8 F (36.6 C) 97.8 F (36.6 C) 100.1 F (37.8 C)  TempSrc: Oral Oral Oral Oral  SpO2: 98% 99% 99% 98%  Weight:      Height:        Intake/Output Summary (Last 24 hours) at 12/26/2023 2111 Last data filed at 12/26/2023 1900 Gross per 24 hour  Intake --  Output 1300 ml  Net -1300 ml   Filed Weights   12/21/23 1644  Weight: 61.2 kg    Examination:   Constitutional: NAD, AAOx3 HEENT: conjunctivae and lids normal, EOMI CV: No cyanosis.   RESP: normal respiratory effort, on RA Abdomen: NG tube present Neuro: II - XII grossly intact.   Psych: Normal mood and affect.  Appropriate judgement and reason   Data Reviewed: I have personally reviewed labs and imaging studies  Time spent: 35 minutes  Garrison Kanner, MD [facility] If 7PM-7AM, please contact night-coverage 12/26/2023, 9:11 PM

## 2023-12-26 NOTE — Plan of Care (Signed)
  Problem: Education: Goal: Knowledge of General Education information will improve Description: Including pain rating scale, medication(s)/side effects and non-pharmacologic comfort measures Outcome: Progressing   Problem: Clinical Measurements: Goal: Diagnostic test results will improve Outcome: Progressing Goal: Respiratory complications will improve Outcome: Progressing Goal: Cardiovascular complication will be avoided Outcome: Progressing   Problem: Activity: Goal: Risk for activity intolerance will decrease Outcome: Progressing   Problem: Coping: Goal: Level of anxiety will decrease Outcome: Progressing   Problem: Elimination: Goal: Will not experience complications related to bowel motility Outcome: Progressing Goal: Will not experience complications related to urinary retention Outcome: Progressing   Problem: Pain Managment: Goal: General experience of comfort will improve and/or be controlled Outcome: Progressing   Problem: Safety: Goal: Ability to remain free from injury will improve Outcome: Progressing   Problem: Skin Integrity: Goal: Risk for impaired skin integrity will decrease Outcome: Progressing

## 2023-12-27 ENCOUNTER — Inpatient Hospital Stay

## 2023-12-27 LAB — BASIC METABOLIC PANEL WITH GFR
Anion gap: 7 (ref 5–15)
BUN: 6 mg/dL (ref 6–20)
CO2: 23 mmol/L (ref 22–32)
Calcium: 7 mg/dL — ABNORMAL LOW (ref 8.9–10.3)
Chloride: 99 mmol/L (ref 98–111)
Creatinine, Ser: 0.64 mg/dL (ref 0.61–1.24)
GFR, Estimated: >60 mL/min (ref >60)
Glucose, Bld: 83 mg/dL (ref 70–99)
Potassium: 3.1 mmol/L — ABNORMAL LOW (ref 3.5–5.1)
Sodium: 129 mmol/L — ABNORMAL LOW (ref 135–145)

## 2023-12-27 LAB — MAGNESIUM: Magnesium: 1.7 mg/dL (ref 1.7–2.4)

## 2023-12-27 MED ORDER — POTASSIUM CHLORIDE 10 MEQ/100ML IV SOLN
10.0000 meq | INTRAVENOUS | Status: AC
  Administered 2023-12-27 (×4): 10 meq via INTRAVENOUS
  Filled 2023-12-27 (×4): qty 100

## 2023-12-27 NOTE — Progress Notes (Signed)
  PROGRESS NOTE    Theodore Adams  ION:[id number] DOB: Sep 13, 1985 DOA: 12/21/2023 PCP: [facility]  213A/213A-AA  LOS: 5 days   Brief hospital course:   Assessment & Plan: Theodore Adams is a 38 y.o. male with medical history significant of smoker, who presents with, nausea, vomiting, diarrhea, abdominal pain.  Patient states that his symptoms have been going on for almost a week, including nausea, vomiting, diarrhea and abdominal pain.  He has multiple episodes of nonbilious nonbloody vomiting, more than 10 times of watery diarrhea each day.  Patient was found to have acute colitis on CT abdomen/pelvis. He was started on IV Zosyn , IV fluids and supportive care.  Stool studies were obtained and returned positive for Norovirus infection.  Pt also developed ileus.  Acute infectious colitis Acute gastroenteropathy due to Norovirus CT scan showed "dilation, marked circumferential wall thickening and submucosal enhancement of the rectum with adjacent fat stranding. This extends into the descending colon in a contiguous fashion.  Differential diagnosis include inflammatory colitis and infectious colitis."   No family history of inflammatory colitis.  No recent antibiotics use.   --received empiric abx  Ileus  -- repeat CT 5/19 with increased distention, now severely dilated small bowel loops with air fluid levels.  Most likely reactive to current infection, but also noted significant use of opioids pain meds. --General Surgery consulted --cont NGT --cont MIVF --minimize opioids  Hyponatremia:  Sodium 126, likely hypovolemic in setting of above.  Improved with IVF --cont MIVF   Smoker --Nicotine  patch  Significant stool burden --cont Miralax    DVT prophylaxis: Lovenox SQ Code Status: Full code  Family Communication: significant other updated at bedside Level of care: Med-Surg Dispo:   The patient is from: home Anticipated d/c is to: home Anticipated  d/c date is: > 3 days   Subjective and Interval History:  No abdominal pain.  BM started to thicken.   Objective: Vitals:   12/26/23 2057 12/27/23 0442 12/27/23 0752 12/27/23 1449  BP: 114/62 (!) 113/59 110/67 114/61  Pulse: 84 75 67 68  Resp: 16 16 17 16   Temp: 100.1 F (37.8 C) 98.8 F (37.1 C) 98.5 F (36.9 C) 98.9 F (37.2 C)  TempSrc: Oral Oral Oral Oral  SpO2: 98% 97% 100% 99%  Weight:      Height:        Intake/Output Summary (Last 24 hours) at 12/27/2023 1920 Last data filed at 12/27/2023 1819 Gross per 24 hour  Intake 208.11 ml  Output 1400 ml  Net -1191.89 ml   Filed Weights   12/21/23 1644  Weight: 61.2 kg    Examination:   Constitutional: NAD, AAOx3 HEENT: conjunctivae and lids normal, EOMI CV: No cyanosis.   RESP: normal respiratory effort, on RA Abdomen: NG tube presnet Neuro: II - XII grossly intact.   Psych: Normal mood and affect.  Appropriate judgement and reason   Data Reviewed: I have personally reviewed labs and imaging studies  Time spent: 35 minutes  Garrison Kanner, MD [facility] If 7PM-7AM, please contact night-coverage 12/27/2023, 7:20 PM

## 2023-12-27 NOTE — Progress Notes (Signed)
 Initial Nutrition Assessment  DOCUMENTATION CODES:   Not applicable  INTERVENTION:   -RD will follow for diet advancement and add supplements as appropriate -If prolonged NPO status is anticipated, consider initiation of TPN. Discussed with pharmacy; pt is at high risk for refeeding syndrome given poor oral intake >4 days. Recommend 100 mg thiamine x 7 days and monitoring Mg, K, and Phos and repletion as appropriate due to high refeeding risk  NUTRITION DIAGNOSIS:   Inadequate oral intake related to altered GI function as evidenced by NPO status.  GOAL:   Patient will meet greater than or equal to 90% of their needs  MONITOR:   Diet advancement  REASON FOR ASSESSMENT:   Rounds    ASSESSMENT:   Pt with medical history significant of smoker, who presents with, nausea, vomiting, diarrhea, abdominal pain.  Pt admitted with acute colitis.   5/18- clear liquid diet 5/19- NGT 5/20- CT scan reveals persistent dilation of the large intestine with dilation of the small intestine consistent with ileus  5/23- NGT out, replaced  Reviewed I/O's: -1.6 L x 24 hours and +2.5 L since admission  NGT output: 1.6 L x 24 hours   Pt sleeping soundly at time of visit. Pt did not respond to voice or touch.   Case discussed with pharmacist, who reports some threshold to start TPN over the weekend. He request RD evaluate pt and provide recommendations.   Per H&P, pt had been experiencing nausea, vomiting, diarrhea, and abdominal pain PTA.   Per general surgery notes, NGT came out this morning, but has been replaced. NGT remains to low, intermittent suction. Abdominal x-ray from yesterday (12/26/23) reassuring, suggestion no obstruction due to contrast in colon. Large and small bowels still distended. Surgery considering TPN.   No recent wt history available to assess acute weight changes. No distant wt loss noted.   Medications reviewed and include lovenox, miralax , potassium chloride, and  0.9% sodium chloride  infusion @ 75 ml/hr.   Labs reviewed: Na: 129, K: 3.1.    NUTRITION - FOCUSED PHYSICAL EXAM:  Flowsheet Row Most Recent Value  Orbital Region No depletion  Upper Arm Region No depletion  Thoracic and Lumbar Region No depletion  Buccal Region No depletion  Temple Region No depletion  Clavicle Bone Region No depletion  Clavicle and Acromion Bone Region No depletion  Scapular Bone Region No depletion  Dorsal Hand No depletion  Patellar Region No depletion  Anterior Thigh Region No depletion  Posterior Calf Region No depletion  Edema (RD Assessment) None  Hair Reviewed  Eyes Reviewed  Mouth Reviewed  Skin Reviewed  Nails Reviewed       Diet Order:   Diet Order             Diet NPO time specified Except for: Ice Chips, Sips with Meds  Diet effective now                   EDUCATION NEEDS:   No education needs have been identified at this time  Skin:  Skin Assessment: Reviewed RN Assessment  Last BM:  12/27/23 (type 6)  Height:   Ht Readings from Last 1 Encounters:  12/21/23 5\' 6"  (1.676 m)    Weight:   Wt Readings from Last 1 Encounters:  12/21/23 61.2 kg    Ideal Body Weight:  59.1 kg  BMI:  Body mass index is 21.79 kg/m.  Estimated Nutritional Needs:   Kcal:  1800-2000  Protein:  90-105 grams  Fluid:  1.8-2.0 L    Herschel Lords, RD, LDN, CDCES Registered Dietitian III Certified Diabetes Care and Education Specialist If unable to reach this RD, please use "RD Inpatient" group chat on secure chat between hours of 8am-4 pm daily

## 2023-12-27 NOTE — Progress Notes (Signed)
 [facility]- General Surgery  SURGICAL PROGRESS NOTE  Hospital Day(s): 5.   HISTORY OF PRESENT ILLNESS (HPI):  38 y.o. male presented to [facility] ED for evaluation of emesis and diarrhea on 12/21/23.  Patient had been experiencing lower abdominal pain and diarrhea over 7 days.  He estimated to have had about 10-15 episodes of loose stool per day, with some dizziness and weakness.  He reported multiple episodes of nonbilious and nonbloody vomiting.  No known alleviating or aggravating factors. CT of abdomen/pelvis showed acute colitis. Patient was admitted by internal medicine.Stool studies were obtained and returned positive for Norovirus infection on 12/22/23. Repeated CT scan showed an increase in small bowel dilatation. Leukocytosis at admission 31.8 and now 16.8. Patient denied any blood in stool.  He reported having more of a solid stool today. Denied experiencing any flatulence. Denied any fevers or chills.   Interval History:  Patient seen and examined. No acute events or new complaints overnight.  Patient reports doing well overall. He states he is now having a combination of both diarrhea and solid stool. Vital signs are stable. He denies any nausea, vomiting, fever or chills. His NG tube came back out this morning.    Vital signs in last 24 hours: [min-max] current  Temp:  [97.8 F (36.6 C)-100.1 F (37.8 C)] 98.5 F (36.9 C) (05/23 0752) Pulse Rate:  [67-86] 67 (05/23 0752) Resp:  [16-18] 17 (05/23 0752) BP: (110-115)/(59-67) 110/67 (05/23 0752) SpO2:  [97 %-100 %] 100 % (05/23 0752)     Height: 5\' 6"  (167.6 cm) Weight: 61.2 kg BMI (Calculated): 21.8   Intake/Output last 2 shifts:  05/22 0701 - 05/23 0700 In: -  Out: 1600 [Emesis/NG output:1600]   Physical Exam:  Constitutional: alert, cooperative and no distress  Respiratory: breathing non-labored at rest  Cardiovascular: regular rate and sinus rhythm  Gastrointestinal: soft, non-tender, and abdominal distention improving     Labs:     Latest Ref Rng & Units 12/25/2023    4:44 AM 12/24/2023    3:48 AM 12/23/2023    4:57 AM  CBC  WBC 4.0 - 10.5 K/uL 9.9  16.8  20.6   Hemoglobin 13.0 - 17.0 g/dL 60.4  54.0  98.1   Hematocrit 39.0 - 52.0 % 34.2  36.3  39.5   Platelets 150 - 400 K/uL 463  364  261       Latest Ref Rng & Units 12/27/2023    5:37 AM 12/26/2023    6:05 AM 12/25/2023    4:44 AM  CMP  Glucose 70 - 99 mg/dL 83  87  191   BUN 6 - 20 mg/dL 6  9  9    Creatinine 0.61 - 1.24 mg/dL 4.78  2.95  6.21   Sodium 135 - 145 mmol/L 129  135  134   Potassium 3.5 - 5.1 mmol/L 3.1  3.4  3.3   Chloride 98 - 111 mmol/L 99  104  99   CO2 22 - 32 mmol/L 23  25  26    Calcium 8.9 - 10.3 mg/dL 7.0  7.4  7.7      Imaging studies:   EXAM:  2 VIEW XRAY OF THE ABDOMEN SUPINE 12/26/2023 08:44:03 AM   COMPARISON: Abdomen radiographs 12/25/2023.   CLINICAL HISTORY: 30865 Abdominal distention [id number]. Lower abdominal pain.     FINDINGS:   BOWEL: Distended loops of large and small bowel again noted. High density contrast is now seen in the proximal colon.   PERITONEUM  AND SOFT TISSUES: No abnormal calcifications.   BONES: No acute osseous abnormality.   IMPRESSION: 1. Distended loops of large and small bowel, consistent with ileus. 2. Progression of contrast into the proximal colon    Assessment/Plan:  38 y.o. male with ileus secondary to infectious colitis due to Norovirus, with medical history of significant back of smoker.    - Abdominal x-ray from yesterday showed contrast in colon, which is reassuring and suggests there is no obstruction. Large and small bowels still distended consistent with ileus - NG tube came out this morning. Place NGT back to low-intermittent suction, will order abdominal x-ray to confirm proper placement of NG tube  - Consider TPN   - Continue to hydrate patient with IV fluids  - Continue pain management   - Continue DVT prophylaxis  -- Sherelle Castelli Barrientos PA-C

## 2023-12-28 LAB — BASIC METABOLIC PANEL WITH GFR
Anion gap: 9 (ref 5–15)
BUN: 5 mg/dL — ABNORMAL LOW (ref 6–20)
CO2: 25 mmol/L (ref 22–32)
Calcium: 7.5 mg/dL — ABNORMAL LOW (ref 8.9–10.3)
Chloride: 102 mmol/L (ref 98–111)
Creatinine, Ser: 0.57 mg/dL — ABNORMAL LOW (ref 0.61–1.24)
GFR, Estimated: >60 mL/min (ref >60)
Glucose, Bld: 106 mg/dL — ABNORMAL HIGH (ref 70–99)
Potassium: 3.4 mmol/L — ABNORMAL LOW (ref 3.5–5.1)
Sodium: 131 mmol/L — ABNORMAL LOW (ref 135–145)

## 2023-12-28 LAB — CBC
HCT: 32.9 % — ABNORMAL LOW (ref 39.0–52.0)
Hemoglobin: 11.4 g/dL — ABNORMAL LOW (ref 13.0–17.0)
MCH: 29.8 pg (ref 26.0–34.0)
MCHC: 34.7 g/dL (ref 30.0–36.0)
MCV: 86.1 fL (ref 80.0–100.0)
Platelets: 672 10*3/uL — ABNORMAL HIGH (ref 150–400)
RBC: 3.82 MIL/uL — ABNORMAL LOW (ref 4.22–5.81)
RDW: 13.2 % (ref 11.5–15.5)
WBC: 9.7 10*3/uL (ref 4.0–10.5)
nRBC: 0 % (ref 0.0–0.2)

## 2023-12-28 LAB — PHOSPHORUS: Phosphorus: 2.4 mg/dL — ABNORMAL LOW (ref 2.5–4.6)

## 2023-12-28 LAB — MAGNESIUM: Magnesium: 1.7 mg/dL (ref 1.7–2.4)

## 2023-12-28 MED ORDER — POTASSIUM PHOSPHATES 15 MMOLE/5ML IV SOLN
30.0000 mmol | Freq: Once | INTRAVENOUS | Status: AC
  Administered 2023-12-28: 30 mmol via INTRAVENOUS
  Filled 2023-12-28: qty 10

## 2023-12-28 NOTE — Plan of Care (Signed)

## 2023-12-28 NOTE — Progress Notes (Signed)
 Patient ID: Theodore Adams, male   DOB: 01-28-1986, 38 y.o.   MRN: [id number]     SURGICAL PROGRESS NOTE   Hospital Day(s): 6.   Interval History: Patient seen and examined, no acute events or new complaints overnight. Patient reports feeling better.  Patient endorses that abdomen feels better and less bloated.  Denies any abdominal pain.  No irritation.  No alleviating or aggravating factors.  Vital signs in last 24 hours: [min-max] current  Temp:  [98.9 F (37.2 C)-99.8 F (37.7 C)] 99 F (37.2 C) (05/24 0839) Pulse Rate:  [68-80] 79 (05/24 0839) Resp:  [16-20] 18 (05/24 0839) BP: (100-114)/(53-61) 108/60 (05/24 0839) SpO2:  [97 %-100 %] 97 % (05/24 0839)     Height: 5\' 6"  (167.6 cm) Weight: 61.2 kg BMI (Calculated): 21.8   Physical Exam:  Constitutional: alert, cooperative and no distress  Respiratory: breathing non-labored at rest  Cardiovascular: regular rate and sinus rhythm  Gastrointestinal: soft, non-tender, less distended  Labs:     Latest Ref Rng & Units 12/28/2023    4:39 AM 12/25/2023    4:44 AM 12/24/2023    3:48 AM  CBC  WBC 4.0 - 10.5 K/uL 9.7  9.9  16.8   Hemoglobin 13.0 - 17.0 g/dL 40.9  81.1  91.4   Hematocrit 39.0 - 52.0 % 32.9  34.2  36.3   Platelets 150 - 400 K/uL 672  463  364       Latest Ref Rng & Units 12/28/2023    4:39 AM 12/27/2023    5:37 AM 12/26/2023    6:05 AM  CMP  Glucose 70 - 99 mg/dL 782  83  87   BUN 6 - 20 mg/dL 5  6  9    Creatinine 0.61 - 1.24 mg/dL 9.56  2.13  0.86   Sodium 135 - 145 mmol/L 131  129  135   Potassium 3.5 - 5.1 mmol/L 3.4  3.1  3.4   Chloride 98 - 111 mmol/L 102  99  104   CO2 22 - 32 mmol/L 25  23  25    Calcium 8.9 - 10.3 mg/dL 7.5  7.0  7.4     Imaging studies: No new pertinent imaging studies   Assessment/Plan:  38 y.o. male with ileus secondary to infectious colitis due to Norovirus, with medical history of significant back of smoker.    -Clinically patient continue very slow recovery but slowly  improving. - Abdominal exam with less distention and less tympanic.  Patient continued having multiple bowel movements - Will NGT today and start clear liquid diet trial.  - If patient get nauseated he can be placed back to low intermittent suction - Encouraged the patient to ambulate - Continue medical management of infectious colitis by primary team.  If no improvement consider consulting ID versus GI  Lucila Rye, MD

## 2023-12-28 NOTE — Progress Notes (Addendum)
  PROGRESS NOTE    Theodore Adams  NWG:[id number] DOB: 1986/04/03 DOA: 12/21/2023 PCP: [facility] [employer]  213A/213A-AA  LOS: 6 days   Brief hospital course:   Assessment & Plan: Theodore Adams is a 38 y.o. male with medical history significant of smoker, who presents with, nausea, vomiting, diarrhea, abdominal pain.  Patient states that his symptoms have been going on for almost a week, including nausea, vomiting, diarrhea and abdominal pain.  He has multiple episodes of nonbilious nonbloody vomiting, more than 10 times of watery diarrhea each day.  Patient was found to have acute colitis on CT abdomen/pelvis. He was started on IV Zosyn , IV fluids and supportive care.  Stool studies were obtained and returned positive for Norovirus infection.  Pt also developed ileus.  Acute infectious colitis Acute gastroenteropathy due to Norovirus CT scan showed "dilation, marked circumferential wall thickening and submucosal enhancement of the rectum with adjacent fat stranding. This extends into the descending colon in a contiguous fashion.  Differential diagnosis include inflammatory colitis and infectious colitis."   No family history of inflammatory colitis.  No recent antibiotics use.   --received empiric abx  Ileus  -- repeat CT 5/19 with increased distention, now severely dilated small bowel loops with air fluid levels.  Most likely reactive to current infection, but also noted significant use of opioids pain meds. --General Surgery consulted --start clear liquid diet --cont MIVF --minimize opioids  Hyponatremia:  Sodium 126, likely hypovolemic in setting of above.  Improved with IVF --cont MIVF for now   Smoker --Nicotine  patch  Significant stool burden --have been having loose BM's --d/c scheduled Miralax   Hypokalemia Hypophos --monitor and supplement PRN   DVT prophylaxis: Lovenox SQ Code Status: Full code  Family Communication: significant other updated  at bedside Level of care: Med-Surg Dispo:   The patient is from: home Anticipated d/c is to: home Anticipated d/c date is: 2- 3 days   Subjective and Interval History:  GenSurg started pt on clear liquid diet, pt reported tolerating it.  Still having liquid BM's.   Objective: Vitals:   12/27/23 2041 12/28/23 0301 12/28/23 0839 12/28/23 1814  BP: (!) 104/58 (!) 100/53 108/60 117/63  Pulse: 75 80 79 85  Resp: 20 20 18 18   Temp: 99 F (37.2 C) 99.8 F (37.7 C) 99 F (37.2 C) 99.3 F (37.4 C)  TempSrc: Oral Oral Oral Oral  SpO2: 100% 99% 97% 100%  Weight:      Height:        Intake/Output Summary (Last 24 hours) at 12/28/2023 1933 Last data filed at 12/28/2023 1842 Gross per 24 hour  Intake 5544.85 ml  Output 0 ml  Net 5544.85 ml   Filed Weights   12/21/23 1644  Weight: 61.2 kg    Examination:   Constitutional: NAD, AAOx3 HEENT: conjunctivae and lids normal, EOMI CV: No cyanosis.   RESP: normal respiratory effort, on RA Neuro: II - XII grossly intact.   Psych: Normal mood and affect.  Appropriate judgement and reason   Data Reviewed: I have personally reviewed labs and imaging studies  Time spent: 50 minutes  Garrison Kanner, MD [facility] If 7PM-7AM, please contact night-coverage 12/28/2023, 7:33 PM

## 2023-12-29 LAB — BASIC METABOLIC PANEL WITH GFR
Anion gap: 7 (ref 5–15)
BUN: <5 mg/dL — ABNORMAL LOW (ref 6–20)
CO2: 26 mmol/L (ref 22–32)
Calcium: 7.5 mg/dL — ABNORMAL LOW (ref 8.9–10.3)
Chloride: 104 mmol/L (ref 98–111)
Creatinine, Ser: 0.53 mg/dL — ABNORMAL LOW (ref 0.61–1.24)
GFR, Estimated: >60 mL/min (ref >60)
Glucose, Bld: 101 mg/dL — ABNORMAL HIGH (ref 70–99)
Potassium: 3.6 mmol/L (ref 3.5–5.1)
Sodium: 137 mmol/L (ref 135–145)

## 2023-12-29 LAB — PHOSPHORUS: Phosphorus: 2.5 mg/dL (ref 2.5–4.6)

## 2023-12-29 LAB — MAGNESIUM: Magnesium: 1.7 mg/dL (ref 1.7–2.4)

## 2023-12-29 NOTE — Progress Notes (Signed)
  PROGRESS NOTE    Theodore Adams  ZOX:[id number] DOB: 06/20/1986 DOA: 12/21/2023 PCP: [facility] [employer]  213A/213A-AA  LOS: 7 days   Brief hospital course:   Assessment & Plan: COLBE VIVIANO is a 38 y.o. male with medical history significant of smoker, who presents with, nausea, vomiting, diarrhea, abdominal pain.  Patient states that his symptoms have been going on for almost a week, including nausea, vomiting, diarrhea and abdominal pain.  He has multiple episodes of nonbilious nonbloody vomiting, more than 10 times of watery diarrhea each day.  Patient was found to have acute colitis on CT abdomen/pelvis. He was started on IV Zosyn , IV fluids and supportive care.  Stool studies were obtained and returned positive for Norovirus infection.  Pt also developed ileus.  Acute infectious colitis Acute gastroenteropathy due to Norovirus CT scan showed "dilation, marked circumferential wall thickening and submucosal enhancement of the rectum with adjacent fat stranding. This extends into the descending colon in a contiguous fashion.  Differential diagnosis include inflammatory colitis and infectious colitis."   No family history of inflammatory colitis.  No recent antibiotics use.   --received empiric abx  Ileus  -- repeat CT 5/19 with increased distention, now severely dilated small bowel loops with air fluid levels.  Most likely reactive to current infection, but also noted significant use of opioids pain meds. --General Surgery consulted --advance to Full liquid --d/c MIVF  Hyponatremia:  Sodium 126, likely hypovolemic in setting of above.  Improved with IVF --d/c MIVF today   Smoker --Nicotine  patch  Significant stool burden --have been having loose BM's  Hypokalemia Hypophos --monitor and supplement PRN   DVT prophylaxis: Lovenox SQ Code Status: Full code  Family Communication: significant other updated at bedside, sister updated on the phone  today Level of care: Med-Surg Dispo:   The patient is from: home Anticipated d/c is to: home Anticipated d/c date is: 1-2 days   Subjective and Interval History:  NG tube out, diet advanced to full liquid, pt tolerating it well.   Objective: Vitals:   12/28/23 2031 12/29/23 0443 12/29/23 0752 12/29/23 1619  BP: 106/62 (!) 92/52 (!) 107/55 (!) 99/59  Pulse: 83 91 86 74  Resp: 18 18 18 18   Temp: 98.8 F (37.1 C) 98.9 F (37.2 C) 98.4 F (36.9 C) 99 F (37.2 C)  TempSrc: Oral  Oral Oral  SpO2: 100% 99% 98% 98%  Weight:      Height:        Intake/Output Summary (Last 24 hours) at 12/29/2023 1757 Last data filed at 12/29/2023 1653 Gross per 24 hour  Intake 6990.6 ml  Output 0 ml  Net 6990.6 ml   Filed Weights   12/21/23 1644  Weight: 61.2 kg    Examination:   Constitutional: NAD, AAOx3 HEENT: conjunctivae and lids normal, EOMI CV: No cyanosis.   RESP: normal respiratory effort, on RA Neuro: II - XII grossly intact.     Data Reviewed: I have personally reviewed labs and imaging studies  Time spent: 35 minutes  Garrison Kanner, MD [facility] If 7PM-7AM, please contact night-coverage 12/29/2023, 5:57 PM

## 2023-12-29 NOTE — Plan of Care (Signed)

## 2023-12-29 NOTE — Progress Notes (Signed)
 Patient ID: Theodore Adams, male   DOB: [birth year], 38 y.o.   MRN: [id number]     SURGICAL PROGRESS NOTE   Hospital Day(s): 7.   Interval History: Patient seen and examined, no acute events or new complaints overnight. Patient reports feel much better.  He endorses that he tolerated liquid diet without nausea or vomiting.  He had NGT clamped for 24 hours.  He continued passing gas and having bowel movement.  He endorsed that the abdomen is softer.  No pain.  No pain radiation.  No alleviating or aggravating factors.  Vital signs in last 24 hours: [min-max] current  Temp:  [98.4 F (36.9 C)-99.3 F (37.4 C)] 98.4 F (36.9 C) (05/25 0752) Pulse Rate:  [79-91] 86 (05/25 0752) Resp:  [18] 18 (05/25 0752) BP: (92-117)/(52-63) 107/55 (05/25 0752) SpO2:  [97 %-100 %] 98 % (05/25 0752)     Height: 5\' 6"  (167.6 cm) Weight: 61.2 kg BMI (Calculated): 21.8   Physical Exam:  Constitutional: alert, cooperative and no distress  Respiratory: breathing non-labored at rest  Cardiovascular: regular rate and sinus rhythm  Gastrointestinal: soft, non-tender, and significantly improved from distention standpoint  Labs:     Latest Ref Rng & Units 12/28/2023    4:39 AM 12/25/2023    4:44 AM 12/24/2023    3:48 AM  CBC  WBC 4.0 - 10.5 K/uL 9.7  9.9  16.8   Hemoglobin 13.0 - 17.0 g/dL 40.9  81.1  91.4   Hematocrit 39.0 - 52.0 % 32.9  34.2  36.3   Platelets 150 - 400 K/uL 672  463  364       Latest Ref Rng & Units 12/29/2023    5:09 AM 12/28/2023    4:39 AM 12/27/2023    5:37 AM  CMP  Glucose 70 - 99 mg/dL 782  956  83   BUN 6 - 20 mg/dL 5  5  6    Creatinine 0.61 - 1.24 mg/dL 2.13  0.86  5.78   Sodium 135 - 145 mmol/L 137  131  129   Potassium 3.5 - 5.1 mmol/L 3.6  3.4  3.1   Chloride 98 - 111 mmol/L 104  102  99   CO2 22 - 32 mmol/L 26  25  23    Calcium 8.9 - 10.3 mg/dL 7.5  7.5  7.0     Imaging studies: No new pertinent imaging studies   Assessment/Plan:  38 y.o. male with ileus secondary to  infectious colitis due to Norovirus, with medical history of significant back of smoker.    -Clinically significantly improved with less sensation. - Patient tolerated clear liquid diet - Nasogastric tube removed and started on full liquid diet - Diet can be advanced as tolerated - Continue medical management of infectious colitis  Lucila Rye, MD

## 2023-12-29 NOTE — Plan of Care (Signed)

## 2023-12-30 LAB — MAGNESIUM: Magnesium: 1.8 mg/dL (ref 1.7–2.4)

## 2023-12-30 LAB — POTASSIUM: Potassium: 3.5 mmol/L (ref 3.5–5.1)

## 2023-12-30 LAB — PHOSPHORUS: Phosphorus: 2.8 mg/dL (ref 2.5–4.6)

## 2023-12-30 MED ORDER — NICOTINE 21 MG/24HR TD PT24: 21.0000 mg | MEDICATED_PATCH | Freq: Every day | TRANSDERMAL | 0 refills | Status: AC

## 2023-12-30 MED ORDER — ADULT MULTIVITAMIN W/MINERALS CH
1.0000 | ORAL_TABLET | Freq: Every day | ORAL | Status: DC
  Administered 2023-12-30: 1 via ORAL
  Filled 2023-12-30: qty 1

## 2023-12-30 MED ORDER — ENSURE ENLIVE PO LIQD: 237.0000 mL | Freq: Two times a day (BID) | ORAL | Status: DC

## 2023-12-30 NOTE — Progress Notes (Signed)
 Nutrition Follow-up  DOCUMENTATION CODES:   Not applicable  INTERVENTION:   -MVI with minerals daily -Ensure Enlive po BID, each supplement provides 350 kcal and 20 grams of protein.  -Continue soft diet  NUTRITION DIAGNOSIS:   Inadequate oral intake related to altered GI function as evidenced by NPO status.  Progressing; advanced to PO diet on 12/28/23  GOAL:   Patient will meet greater than or equal to 90% of their needs  Progressing   MONITOR:   PO intake, Supplement acceptance  REASON FOR ASSESSMENT:   Rounds    ASSESSMENT:   Pt with medical history significant of smoker, who presents with, nausea, vomiting, diarrhea, abdominal pain.  5/18- clear liquid diet 5/19- NGT 5/20- CT scan reveals persistent dilation of the large intestine with dilation of the small intestine consistent with ileus  5/23- NGT out, replaced 5/24- NGT clamped, advanced to clear liquid diet 5/25- NGT d/c, advanced to full liquid diet 5/26- advanced to soft diet  Reviewed I/O's: +600 ml x 24 hours and +9.1 L since admission   Pt unavailable at time of visit. Attempted to speak with pt via call to hospital room phone, however, unable to reach.   Pt now on a soft diet and tolerating well. Noted meal completions 100%.   No new wt since admission.  Medications reviewed and include lovenox.  Labs reviewed.    Diet Order:   Diet Order             DIET SOFT Fluid consistency: Thin  Diet effective now                   EDUCATION NEEDS:   No education needs have been identified at this time  Skin:  Skin Assessment: Reviewed RN Assessment  Last BM:  12/30/23 (type 7)  Height:   Ht Readings from Last 1 Encounters:  12/21/23 5\' 6"  (1.676 m)    Weight:   Wt Readings from Last 1 Encounters:  12/21/23 61.2 kg    Ideal Body Weight:  59.1 kg  BMI:  Body mass index is 21.79 kg/m.  Estimated Nutritional Needs:   Kcal:  1800-2000  Protein:  90-105 grams  Fluid:   1.8-2.0 L    Herschel Lords, RD, LDN, CDCES Registered Dietitian III Certified Diabetes Care and Education Specialist If unable to reach this RD, please use "RD Inpatient" group chat on secure chat between hours of 8am-4 pm daily

## 2023-12-30 NOTE — Plan of Care (Signed)

## 2023-12-30 NOTE — Discharge Summary (Signed)
 Physician Discharge Summary   DOIL KAMARA  male DOB: 08-06-1986  [name]:[id number]  PCP: [facility]  Admit date: 12/21/2023 Discharge date: 12/30/2023  Admitted From: home Disposition:  home Girlfriend updated at bedside prior to discharge. CODE STATUS: Full code   Hospital Course:  For full details, please see H&P, progress notes, consult notes and ancillary notes.  Briefly,  Theodore Adams is a 38 y.o. male with medical history significant of smoker, who presented with, nausea, vomiting, diarrhea, abdominal pain for about a week.  Patient was found to have acute colitis on CT abdomen/pelvis.  He was started on IV Zosyn , IV fluids and supportive care.  Stool studies were obtained and returned positive for Norovirus infection.  Pt also developed ileus.   Acute infectious colitis Acute gastroenteropathy due to Norovirus CT scan showed "dilation, marked circumferential wall thickening and submucosal enhancement of the rectum with adjacent fat stranding. This extends into the descending colon in a contiguous fashion.  Differential diagnosis include inflammatory colitis and infectious colitis."   No family history of inflammatory colitis.  No recent antibiotics use.   --received empiric abx with 2 days of zosyn  f/b 4 days of ceftriaxone /flagyl .   Ileus  -- repeat CT 5/19 with increased distention, now severely dilated small bowel loops with air fluid levels.  Ileus most likely reactive to current infection, but also noted significant use of opioids pain meds. --General Surgery consulted.  Pt was managed on NGT until abdominal pain resolved (pt was having liquid BM's during this time), and diet slowly advanced.     Hyponatremia:  Sodium 126, likely hypovolemic in setting of above.  Resolved with IVF.   Smoker --Nicotine  patch   Significant stool burden --seen on initial CT scan, but had had multiple BM's since.   Hypokalemia Hypophos --monitored and  supplemented PRN   Discharge Diagnoses:  Principal Problem:   Acute gastroenteropathy due to Norovirus Active Problems:   Acute colitis   Hyponatremia   Smoker   30 Day Unplanned Readmission Risk Score    Flowsheet Row ED to Hosp-Admission (Current) from 12/21/2023 in [place] [facility]  30 Day Unplanned Readmission Risk Score (%) 12.25 Filed at 12/30/2023 0801       This score is the patient's risk of an unplanned readmission within 30 days of being discharged (0 -100%). The score is based on dignosis, age, lab data, medications, orders, and past utilization.   Low:  0-14.9   Medium: 15-21.9   High: 22-29.9   Extreme: 30 and above         Discharge Instructions:  Allergies as of 12/30/2023   No Known Allergies      Medication List     STOP taking these medications    mupirocin  ointment 2 % Commonly known as: Bactroban        TAKE these medications    nicotine  21 mg/24hr patch Commonly known as: NICODERM CQ  - dosed in mg/24 hours Place 1 patch (21 mg total) onto the skin daily. Start taking on: Dec 31, 2023         Follow-up Information     [facility] Follow up in 1 week(s).   Contact information: [address] [place] [address] [phone]                 No Known Allergies   The results of significant diagnostics from this hospitalization (including imaging, microbiology, ancillary and laboratory) are listed  below for reference.   Consultations:   Procedures/Studies: DG Abd 1 View Result Date: 12/27/2023 CLINICAL DATA:  NG tube placement EXAM: ABDOMEN - 1 VIEW COMPARISON:  Radiograph yesterday FINDINGS: The tip and side port of the enteric tube are below the diaphragm, however the tube is kinked at the side port in the tip is directed cranially. There is barium within the colon. Gaseous bowel distension centrally persists. IMPRESSION: The tip and side port of the enteric tube are  below the diaphragm, however the tube is kinked at the side port and the tip is directed cranially. Recommend correlation with function, consider repositioning to unkink the tube. Electronically Signed   By: Chadwick Colonel M.D.   On: 12/27/2023 14:12   DG Abd 2 Views Result Date: 12/26/2023 EXAM: 2 VIEW XRAY OF THE ABDOMEN SUPINE 12/26/2023 08:44:03 AM COMPARISON: Abdomen radiographs 12/25/2023. CLINICAL HISTORY: 19147 Abdominal distention [id number]. Lower abdominal pain. FINDINGS: BOWEL: Distended loops of large and small bowel again noted. High density contrast is now seen in the proximal colon. PERITONEUM AND SOFT TISSUES: No abnormal calcifications. BONES: No acute osseous abnormality. IMPRESSION: 1. Distended loops of large and small bowel, consistent with ileus. 2. Progression of contrast into the proximal colon Electronically signed by: Audree Leas MD 12/26/2023 12:33 PM EDT RP Workstation: [id number]   DG Abd Portable 1V-Small Bowel Obstruction Protocol-initial, 8 hr delay Result Date: 12/25/2023 CLINICAL DATA:  8 hour small-bowel follow up EXAM: PORTABLE ABDOMEN - 1 VIEW COMPARISON:  Film from earlier in the same day FINDINGS: Gastric catheter remains in the distal esophagus. This should be advanced several cm deeper into the stomach. Administered contrast lies predominately throughout the dilated small bowel. No significant colonic contrast is noted at this time. No free air is seen. IMPRESSION: Stable small bowel dilatation. The prominent amount of contrast lies within the small bowel. 24 hour follow-up is recommended. Electronically Signed   By: Violeta Grey M.D.   On: 12/25/2023 20:44   DG Abd 2 Views Result Date: 12/25/2023 CLINICAL DATA:  [id number] Ileus [facility]) [phone] EXAM: ABDOMEN - 2 VIEW COMPARISON:  12/23/2023 FINDINGS: The gastric tube has pulled back, tip near the GE junction. Multiple gas distended loops of small bowel and colon as before. Stomach is nondilated. IMPRESSION: 1. Gastric  tube tip near the GE junction. 2. Persistent ileus. Electronically Signed   By: Nicoletta Barrier M.D.   On: 12/25/2023 11:46   DG Abd 1 View Result Date: 12/23/2023 CLINICAL DATA:  NG tube placement EXAM: ABDOMEN - 1 VIEW COMPARISON:  CT today FINDINGS: NG tube loops in the fundus of the stomach. Significant gaseous distention of large and small bowel noted. No free air or organomegaly. IMPRESSION: NG tube loops in the stomach. Electronically Signed   By: Janeece Mechanic M.D.   On: 12/23/2023 18:33   CT ABDOMEN PELVIS W CONTRAST Result Date: 12/23/2023 CLINICAL DATA:  Abdominal pain, acute, nonlocalized Follow up on colitis for ?complications, worsening distention EXAM: CT ABDOMEN AND PELVIS WITH CONTRAST TECHNIQUE: Multidetector CT imaging of the abdomen and pelvis was performed using the standard protocol following bolus administration of intravenous contrast. RADIATION DOSE REDUCTION: This exam was performed according to the departmental dose-optimization program which includes automated exposure control, adjustment of the mA and/or kV according to patient size and/or use of iterative reconstruction technique. CONTRAST:  80mL OMNIPAQUE  IOHEXOL  300 MG/ML  SOLN COMPARISON:  12/21/2023 FINDINGS: Lower chest: Trace bilateral pleural effusions. No confluent airspace opacities. Wall thickening in the distal esophagus suggesting  esophagitis. Hepatobiliary: No focal hepatic abnormality. Gallbladder unremarkable. Pancreas: No focal abnormality or ductal dilatation. Spleen: No focal abnormality.  Normal size. Adrenals/Urinary Tract: No adrenal abnormality. No focal renal abnormality. No stones or hydronephrosis. Urinary bladder is unremarkable. Stomach/Bowel: Continued wall thickening and mucosal enhancement involving the rectum and extending into the lower sigmoid colon. Significant upstream stool burden and gaseous distention of the colon, with increasing small bowel dilatation. Small bowel loops measure up 2 4.4 cm in  diameter, many of which are fluid-filled with scattered air-fluid levels. Is difficult to identify the terminal ileum given the degree of colonic and small bowel distension. Vascular/Lymphatic: No evidence of aneurysm or adenopathy. Reproductive: No visible focal abnormality. Other: No free fluid or free air. Musculoskeletal: No acute bony abnormality. IMPRESSION: Continued wall thickening in the rectum and distal sigmoid colon with significant upstream gaseous distention of the colon and now diffusely throughout the small bowel. Small bowel measures up to 4.4 cm in diameter. Findings concerning for distal colonic colitis, infectious versus inflammatory. The diffuse gaseous distention of bowel suggest significant ileus. Wall thickening in the distal esophagus suggests esophagitis. Trace bilateral pleural effusions. Electronically Signed   By: Janeece Mechanic M.D.   On: 12/23/2023 18:32   CT ABDOMEN PELVIS W CONTRAST Result Date: 12/21/2023 CLINICAL DATA:  lower abd pain, diarrhea. WBC 31k EXAM: CT ABDOMEN AND PELVIS WITH CONTRAST TECHNIQUE: Multidetector CT imaging of the abdomen and pelvis was performed using the standard protocol following bolus administration of intravenous contrast. RADIATION DOSE REDUCTION: This exam was performed according to the departmental dose-optimization program which includes automated exposure control, adjustment of the mA and/or kV according to patient size and/or use of iterative reconstruction technique. CONTRAST:  80mL OMNIPAQUE  IOHEXOL  300 MG/ML  SOLN COMPARISON:  None Available. FINDINGS: Lower chest: No acute abnormality. Hepatobiliary: No focal liver abnormality is seen. No gallstones, gallbladder wall thickening, or biliary dilatation. Pancreas: Unremarkable. No pancreatic ductal dilatation or surrounding inflammatory changes. Spleen: Normal in size without focal abnormality. Adrenals/Urinary Tract: Adrenal glands are unremarkable. Kidneys are normal, without renal calculi,  focal lesion, or hydronephrosis. Bladder is unremarkable. Stomach/Bowel: There is dilation of the distal colon with marked circumferential wall thickening and submucosal enhancement of the rectum. There is adjacent fat stranding in the perirectal fat. Submucosal enhancement and wall thickening extends into the sigmoid colon and descending colon. These portions of the colon are relatively featureless in appearance. There is moderate upstream colonic stool volume. Appendix is normal. Stomach is unremarkable for degree of distension. No definitive small bowel obstruction although several loops of small bowel are prominent in appearance. Vascular/Lymphatic: Aorta is normal in course and caliber. Prominent mesenteric venous vasculature. No definitive lymphadenopathy identified. Reproductive: Prostate is unremarkable. Other: No free air. Musculoskeletal: No acute or significant osseous findings. IMPRESSION: 1. There is dilation, marked circumferential wall thickening and submucosal enhancement of the rectum with adjacent fat stranding. This extends into the descending colon in a contiguous fashion. Findings are favored to reflect an inflammatory colitis such as ulcerative colitis although infectious colitis remains in the differential. Recommend correlation with laboratory values and consideration of colonoscopy when clinically appropriate. Electronically Signed   By: Clancy Crimes M.D.   On: 12/21/2023 18:22      Labs: BNP (last 3 results) No results for input(s): "BNP" in the last 8760 hours. Basic Metabolic Panel: Recent Labs  Lab 12/25/23 0444 12/26/23 0605 12/27/23 0537 12/28/23 0439 12/29/23 0509 12/30/23 0511  NA 134* 135 129* 131* 137  --  K 3.3* 3.4* 3.1* 3.4* 3.6 3.5  CL 99 104 99 102 104  --   CO2 26 25 23 25 26   --   GLUCOSE 113* 87 83 106* 101*  --   BUN 9 9 6  5* <5*  --   CREATININE 0.61 0.64 0.64 0.57* 0.53*  --   CALCIUM 7.7* 7.4* 7.0* 7.5* 7.5*  --   MG 1.8 1.8 1.7 1.7 1.7  1.8  PHOS 2.5  --   --  2.4* 2.5 2.8   Liver Function Tests: Recent Labs  Lab 12/24/23 0348  AST 20  ALT 21  ALKPHOS 80  BILITOT 1.2  PROT 5.2*  ALBUMIN 1.8*   No results for input(s): "LIPASE", "AMYLASE" in the last 168 hours. No results for input(s): "AMMONIA" in the last 168 hours. CBC: Recent Labs  Lab 12/24/23 0348 12/25/23 0444 12/28/23 0439  WBC 16.8* 9.9 9.7  NEUTROABS 12.8*  --   --   HGB 12.5* 11.8* 11.4*  HCT 36.3* 34.2* 32.9*  MCV 84.6 85.5 86.1  PLT 364 463* 672*   Cardiac Enzymes: No results for input(s): "CKTOTAL", "CKMB", "CKMBINDEX", "TROPONINI" in the last 168 hours. BNP: Invalid input(s): "POCBNP" CBG: No results for input(s): "GLUCAP" in the last 168 hours. D-Dimer No results for input(s): "DDIMER" in the last 72 hours. Hgb A1c No results for input(s): "HGBA1C" in the last 72 hours. Lipid Profile No results for input(s): "CHOL", "HDL", "LDLCALC", "TRIG", "CHOLHDL", "LDLDIRECT" in the last 72 hours. Thyroid function studies No results for input(s): "TSH", "T4TOTAL", "T3FREE", "THYROIDAB" in the last 72 hours.  Invalid input(s): "FREET3" Anemia work up No results for input(s): "VITAMINB12", "FOLATE", "FERRITIN", "TIBC", "IRON", "RETICCTPCT" in the last 72 hours. Urinalysis    Component Value Date/Time   COLORURINE YELLOW (A) 12/21/2023 1900   APPEARANCEUR CLEAR (A) 12/21/2023 1900   LABSPEC 1.026 12/21/2023 1900   PHURINE 5.0 12/21/2023 1900   GLUCOSEU NEGATIVE 12/21/2023 1900   HGBUR MODERATE (A) 12/21/2023 1900   BILIRUBINUR NEGATIVE 12/21/2023 1900   KETONESUR NEGATIVE 12/21/2023 1900   PROTEINUR 30 (A) 12/21/2023 1900   NITRITE NEGATIVE 12/21/2023 1900   LEUKOCYTESUR NEGATIVE 12/21/2023 1900   Sepsis Labs Recent Labs  Lab 12/24/23 0348 12/25/23 0444 12/28/23 0439  WBC 16.8* 9.9 9.7   Microbiology Recent Results (from the past 240 hours)  Blood culture (routine x 2)     Status: None   Collection Time: 12/21/23  5:15 PM    Specimen: BLOOD RIGHT ARM  Result Value Ref Range Status   Specimen Description BLOOD RIGHT ARM  Final   Special Requests   Final    BOTTLES DRAWN AEROBIC AND ANAEROBIC Blood Culture results may not be optimal due to an inadequate volume of blood received in culture bottles   Culture   Final    NO GROWTH 5 DAYS Performed at [facility], [address] [place], [place] [address]    Report Status 12/26/2023 FINAL  Final  Blood culture (routine x 2)     Status: None   Collection Time: 12/21/23  5:16 PM   Specimen: BLOOD LEFT ARM  Result Value Ref Range Status   Specimen Description BLOOD LEFT ARM  Final   Special Requests   Final    BOTTLES DRAWN AEROBIC AND ANAEROBIC Blood Culture results may not be optimal due to an inadequate volume of blood received in culture bottles   Culture   Final    NO GROWTH 5 DAYS Performed at [facility], [address] [place], [place] [address]    Report Status 12/26/2023 FINAL  Final  C Difficile Quick Screen w PCR reflex     Status: None   Collection Time: 12/21/23 11:36 PM   Specimen: STOOL  Result Value Ref Range Status   C Diff antigen NEGATIVE NEGATIVE Final   C Diff toxin NEGATIVE NEGATIVE Final   C Diff interpretation No C. difficile detected.  Final    Comment: Performed at [facility], [address] [place], [place] [address]  Gastrointestinal Panel by PCR , Stool     Status: Abnormal   Collection Time: 12/21/23 11:36 PM   Specimen: STOOL  Result Value Ref Range Status   Campylobacter species NOT DETECTED NOT DETECTED Final   Plesimonas shigelloides NOT DETECTED NOT DETECTED Final   Salmonella species NOT DETECTED NOT DETECTED Final   Yersinia enterocolitica NOT DETECTED NOT DETECTED Final   Vibrio species NOT DETECTED NOT DETECTED Final   Vibrio cholerae NOT DETECTED NOT DETECTED Final   Enteroaggregative E coli (EAEC) NOT DETECTED NOT DETECTED Final   Enteropathogenic E coli (EPEC) NOT DETECTED NOT  DETECTED Final   Enterotoxigenic E coli (ETEC) NOT DETECTED NOT DETECTED Final   Shiga like toxin producing E coli (STEC) NOT DETECTED NOT DETECTED Final   Shigella/Enteroinvasive E coli (EIEC) NOT DETECTED NOT DETECTED Final   Cryptosporidium NOT DETECTED NOT DETECTED Final   Cyclospora cayetanensis NOT DETECTED NOT DETECTED Final   Entamoeba histolytica NOT DETECTED NOT DETECTED Final   Giardia lamblia NOT DETECTED NOT DETECTED Final   Adenovirus F40/41 NOT DETECTED NOT DETECTED Final   Astrovirus NOT DETECTED NOT DETECTED Final   Norovirus GI/GII DETECTED (A) NOT DETECTED Final    Comment: CRITICAL RESULT CALLED TO, READ BACK BY AND VERIFIED WITH: SASHONIE TAYLOR RN @ [phone] 12/22/23 [facility]    Rotavirus A NOT DETECTED NOT DETECTED Final   Sapovirus (I, II, IV, and V) NOT DETECTED NOT DETECTED Final    Comment: Performed at [facility], [address] [place], [place] [address]     Total time spend on discharging this patient, including the last patient exam, discussing the hospital stay, instructions for ongoing care as it relates to all pertinent caregivers, as well as preparing the medical discharge records, prescriptions, and/or referrals as applicable, is 30 minutes.    Garrison Kanner, MD  [facility] 12/30/2023, 11:46 AM

## 2023-12-30 NOTE — Progress Notes (Signed)
 Patient ID: Theodore Adams, male   DOB: 10/18/1985, 38 y.o.   MRN: [id number]     SURGICAL PROGRESS NOTE   Hospital Day(s): 8.   Interval History: Patient seen and examined, no acute events or new complaints overnight. Patient reports feeling well this morning.  He denies abdominal pain.  No pain radiation.  No alleviating or aggravating factors.  He endorsed that he had flank this morning without any nausea or vomiting.  He continued having bowel movements.  He is passing gas.  Vital signs in last 24 hours: [min-max] current  Temp:  [98.3 F (36.8 C)-99 F (37.2 C)] 98.8 F (37.1 C) (05/26 0756) Pulse Rate:  [67-78] 78 (05/26 0756) Resp:  [17-18] 18 (05/26 0756) BP: (99-110)/(58-64) 102/60 (05/26 0756) SpO2:  [98 %-100 %] 100 % (05/26 0756)     Height: 5\' 6"  (167.6 cm) Weight: 61.2 kg BMI (Calculated): 21.8   Physical Exam:  Constitutional: alert, cooperative and no distress  Respiratory: breathing non-labored at rest  Cardiovascular: regular rate and sinus rhythm  Gastrointestinal: soft, non-tender, and non-distended  Labs:     Latest Ref Rng & Units 12/28/2023    4:39 AM 12/25/2023    4:44 AM 12/24/2023    3:48 AM  CBC  WBC 4.0 - 10.5 K/uL 9.7  9.9  16.8   Hemoglobin 13.0 - 17.0 g/dL 57.8  46.9  62.9   Hematocrit 39.0 - 52.0 % 32.9  34.2  36.3   Platelets 150 - 400 K/uL 672  463  364       Latest Ref Rng & Units 12/30/2023    5:11 AM 12/29/2023    5:09 AM 12/28/2023    4:39 AM  CMP  Glucose 70 - 99 mg/dL  528  413   BUN 6 - 20 mg/dL  <5  5   Creatinine 2.44 - 1.24 mg/dL  0.10  2.72   Sodium 536 - 145 mmol/L  137  131   Potassium 3.5 - 5.1 mmol/L 3.5  3.6  3.4   Chloride 98 - 111 mmol/L  104  102   CO2 22 - 32 mmol/L  26  25   Calcium 8.9 - 10.3 mg/dL  7.5  7.5     Imaging studies: No new pertinent imaging studies   Assessment/Plan:  38 y.o. male with ileus secondary to infectious colitis due to Norovirus, with medical history of significant back of smoker.    -  Resolved ileus.  Abdomen is soft, nondistended.  He is tolerating solid diet - Patient tolerated soft diet - No surgical intervention was needed at this case.  General surgery will sign off. No outpatient follow up needed.  -Continue medical management of infectious colitis as per Hospitalist.  Lucila Rye, MD
